# Patient Record
Sex: Male | Born: 1980 | Race: White | Hispanic: No | Marital: Married | Smoking: Former smoker
Health system: Southern US, Community
[De-identification: ages and names within clinical notes are randomized; demographics above are authoritative.]

## PROBLEM LIST (undated history)

## (undated) DIAGNOSIS — E039 Hypothyroidism, unspecified: Secondary | ICD-10-CM

## (undated) DIAGNOSIS — F419 Anxiety disorder, unspecified: Principal | ICD-10-CM

## (undated) DIAGNOSIS — S83206A Unspecified tear of unspecified meniscus, current injury, right knee, initial encounter: Secondary | ICD-10-CM

## (undated) HISTORY — DX: Hypothyroidism, unspecified: E03.9

## (undated) HISTORY — DX: Anxiety disorder, unspecified: F41.9

## (undated) HISTORY — DX: Unspecified tear of unspecified meniscus, current injury, right knee, initial encounter: S83.206A

---

## 2012-08-26 HISTORY — PX: VASECTOMY: SHX75

## 2012-11-04 ENCOUNTER — Ambulatory Visit (INDEPENDENT_AMBULATORY_CARE_PROVIDER_SITE_OTHER): Payer: BC Managed Care – PPO | Admitting: Family Medicine

## 2012-11-04 ENCOUNTER — Encounter: Payer: Self-pay | Admitting: Family Medicine

## 2012-11-04 VITALS — BP 129/80 | HR 82 | Ht 72.0 in | Wt 207.0 lb

## 2012-11-04 DIAGNOSIS — Z131 Encounter for screening for diabetes mellitus: Secondary | ICD-10-CM

## 2012-11-04 DIAGNOSIS — Z113 Encounter for screening for infections with a predominantly sexual mode of transmission: Secondary | ICD-10-CM

## 2012-11-04 DIAGNOSIS — F411 Generalized anxiety disorder: Secondary | ICD-10-CM

## 2012-11-04 DIAGNOSIS — S83206A Unspecified tear of unspecified meniscus, current injury, right knee, initial encounter: Secondary | ICD-10-CM | POA: Insufficient documentation

## 2012-11-04 DIAGNOSIS — F419 Anxiety disorder, unspecified: Secondary | ICD-10-CM

## 2012-11-04 DIAGNOSIS — E039 Hypothyroidism, unspecified: Secondary | ICD-10-CM

## 2012-11-04 DIAGNOSIS — G4730 Sleep apnea, unspecified: Secondary | ICD-10-CM

## 2012-11-04 DIAGNOSIS — Z13220 Encounter for screening for lipoid disorders: Secondary | ICD-10-CM

## 2012-11-04 HISTORY — DX: Unspecified tear of unspecified meniscus, current injury, right knee, initial encounter: S83.206A

## 2012-11-04 HISTORY — DX: Anxiety disorder, unspecified: F41.9

## 2012-11-04 HISTORY — DX: Hypothyroidism, unspecified: E03.9

## 2012-11-04 MED ORDER — LEVOTHYROXINE SODIUM 112 MCG PO TABS: 112.0000 ug | ORAL_TABLET | Freq: Every day | ORAL | Status: DC

## 2012-11-04 MED ORDER — ALPRAZOLAM 1 MG PO TABS: 1.0000 mg | ORAL_TABLET | Freq: Every evening | ORAL | Status: DC | PRN

## 2012-11-04 NOTE — Progress Notes (Signed)
CC: Joshua Kaiser is a 31 y.o. male is here for Establish Care and refills   Subjective: HPI:  Pleasant 31 year old here to establish care.  Carries a diagnosis of hypothyroidism: He's been taking thyroid replacement hormone for years. He reports trouble in the past finding an appropriate dose. He is unsure recent TSH date or value.  He's noticed slight unintentional weight gain over the past 3 months. He denies fatigue, uncontrolled anxiety, depression, constipation or diarrhea, hair or skin changes.  Carries a diagnosis anxiety for years. Has been using Xanax for 2-3 years. Taking it once a day and is very satisfied with the effect. Reports no history of substance or alcohol abuse. Denies paranoia, depression, nor other mental disturbance. Anxiety seems to be worse when in high stress environment such as work. Improves on the weekends when not at work and spending time for this family  He's never had his cholesterol checked, he has never been screened for type 2 diabetes.  He would like to be screened for common STDs but has no specific complaints or concerns or suspicions. Sexually active with a single partner, his wife  His wife tells him that he often stops breathing in his sleep while snoring, he will stop breathing for matter of seconds. He does not have any recollection of this happening. He denies nonrestorative sleep. He believes this is been going on for years it has gotten worse with recent weight gain. The above happens on a daily/nightly basis. Nothing in particular seems to make it better or worse  Review of Systems - General ROS: negative for - chills, fever, night sweats, weight gain or weight loss Ophthalmic ROS: negative for - decreased vision Psychological ROS: negative for -  depression ENT ROS: negative for - hearing change, nasal congestion, tinnitus or allergies Hematological and Lymphatic ROS: negative for - bleeding problems, bruising or swollen lymph nodes Breast ROS:  negative Respiratory ROS: no cough, shortness of breath, or wheezing Cardiovascular ROS: no chest pain or dyspnea on exertion Gastrointestinal ROS: no abdominal pain, change in bowel habits, or black or bloody stools Genito-Urinary ROS: negative for - genital discharge, genital ulcers, incontinence or abnormal bleeding from genitals Musculoskeletal ROS: negative for - joint pain or muscle pain Neurological ROS: negative for - headaches or memory loss Dermatological ROS: negative for lumps, mole changes, rash and skin lesion changes  Past Medical History  Diagnosis Date  . Hypothyroidism 11/04/2012  . Anxiety 11/04/2012  . Right knee meniscal tear 11/04/2012  . Right knee meniscal tear 11/04/2012     Family History  Problem Relation Age of Onset  . Hypertension Father   . Depression Mother      History  Substance Use Topics  . Smoking status: Current Every Day Smoker    Types: Cigarettes  . Smokeless tobacco: Not on file  . Alcohol Use: No     Objective: Filed Vitals:   11/04/12 0827  BP: 129/80  Pulse: 82    General: Alert and Oriented, No Acute Distress HEENT: Pupils equal, round, reactive to light. Conjunctivae clear.  External ears unremarkable, canals clear with intact TMs with appropriate landmarks.  Middle ear appears open without effusion. Pink inferior turbinates.  Moist mucous membranes, pharynx without inflammation nor lesions.  Neck supple without palpable lymphadenopathy nor abnormal masses. Lungs: Clear to auscultation bilaterally, no wheezing/ronchi/rales.  Comfortable work of breathing. Good air movement. Cardiac: Regular rate and rhythm. Normal S1/S2.  No murmurs, rubs, nor gallops.  . Extremities: No peripheral edema.  Strong peripheral pulses.  Mental Status: No depression, anxiety, nor agitation. Skin: Warm and dry.  Assessment & Plan: Shooter was seen today for establish care and refills.  Diagnoses and associated orders for this  visit:  Anxiety - ALPRAZolam (XANAX) 1 MG tablet; Take 1 tablet (1 mg total) by mouth at bedtime as needed.  Hypothyroidism - TSH - levothyroxine (SYNTHROID, LEVOTHROID) 112 MCG tablet; Take 1 tablet (112 mcg total) by mouth daily.  Screening, lipid - Lipid panel  Screening for diabetes mellitus - BASIC METABOLIC PANEL WITH GFR  Screen for std (sexually transmitted disease) - RPR - HIV Antibody ( Reflex) - Hepatitis B Surface AntiGEN - GC/chlamydia probe amp, urine  Sleep apnea - Nocturnal polysomnography (NPSG); Future  Other Orders - Discontinue: levothyroxine (SYNTHROID, LEVOTHROID) 112 MCG tablet; Take 112 mcg by mouth daily. - Discontinue: ALPRAZolam (XANAX) 1 MG tablet; Take 1 mg by mouth at bedtime as needed.    Anxiety: Stable and controlled, continue when necessary use of Xanax, [place] controlled substance database reviewed without suspicious activity Hypothyroidism: Clinically sounds  uncontrolled with weight gain, checking TSH and will adjust levothyroxine as needed He's due for dyslipidemia screening He is due for BMP for diabetic screening Sleep apnea: I believe he warrants a sleep study for consideration of CPAP We'll be checking for common STDs listed above  Return in about 3 months (around 02/02/2013).

## 2012-11-16 LAB — LIPID PANEL
Cholesterol: 145 mg/dL (ref 0–200)
HDL: 46 mg/dL (ref >39)
LDL Cholesterol: 87 mg/dL (ref 0–99)
Total CHOL/HDL Ratio: 3.2 ratio
Triglycerides: 60 mg/dL (ref <150)
VLDL: 12 mg/dL (ref 0–40)

## 2012-11-16 LAB — TSH: TSH: 6.966 u[IU]/mL — ABNORMAL HIGH (ref 0.350–4.500)

## 2012-11-16 LAB — BASIC METABOLIC PANEL WITH GFR
BUN: 14 mg/dL (ref 6–23)
CO2: 24 mEq/L (ref 19–32)
GFR, Est African American: >89 mL/min
Glucose, Bld: 92 mg/dL (ref 70–99)
Potassium: 4.1 mEq/L (ref 3.5–5.3)

## 2012-11-16 LAB — HEPATITIS B SURFACE ANTIGEN: Hepatitis B Surface Ag: NEGATIVE

## 2012-11-17 ENCOUNTER — Telehealth: Payer: Self-pay | Admitting: Family Medicine

## 2012-11-17 DIAGNOSIS — E039 Hypothyroidism, unspecified: Secondary | ICD-10-CM

## 2012-11-17 LAB — GC/CHLAMYDIA PROBE AMP, URINE
Chlamydia, Swab/Urine, PCR: NEGATIVE
GC Probe Amp, Urine: NEGATIVE

## 2012-11-17 MED ORDER — LEVOTHYROXINE SODIUM 125 MCG PO TABS: 125.0000 ug | ORAL_TABLET | Freq: Every day | ORAL | Status: DC

## 2012-11-17 NOTE — Telephone Encounter (Signed)
Joshua Kaiser, Will you please let Joshua Kaiser know that his thyroid function appears to be slightly underactive.  I've set in a slightly [facility] [address]  It'll be a 125 microgram tablet to replace the former 112.  I'd like him to return in 3 months so we can recheck his thyroid function. So far, alll his other labs have been negative from a infectious stand point, there's one more pending but I'll let him know if that comes back concerning.  His cholesterol panel looks amazing as does his kidney function and fasting glucose.  There's no need to change any other medications, diet, lifestyle habits at this time. Thank you

## 2012-11-17 NOTE — Telephone Encounter (Signed)
Pt.notified

## 2012-12-24 ENCOUNTER — Other Ambulatory Visit: Payer: Self-pay

## 2013-01-06 ENCOUNTER — Other Ambulatory Visit: Payer: Self-pay | Admitting: Family Medicine

## 2013-01-06 NOTE — Telephone Encounter (Signed)
(  [place] controlled sub database checked)  Sue Lush, Will you please fax to pharmacy or allow him to pick up the Rx placed in your inbox.  Thank you.

## 2013-02-01 ENCOUNTER — Encounter: Payer: Self-pay | Admitting: Family Medicine

## 2013-02-01 ENCOUNTER — Ambulatory Visit (INDEPENDENT_AMBULATORY_CARE_PROVIDER_SITE_OTHER): Payer: BC Managed Care – PPO | Admitting: Family Medicine

## 2013-02-01 VITALS — BP 126/73 | HR 74 | Wt 197.0 lb

## 2013-02-01 DIAGNOSIS — R0609 Other forms of dyspnea: Secondary | ICD-10-CM

## 2013-02-01 DIAGNOSIS — F419 Anxiety disorder, unspecified: Secondary | ICD-10-CM

## 2013-02-01 DIAGNOSIS — E039 Hypothyroidism, unspecified: Secondary | ICD-10-CM

## 2013-02-01 DIAGNOSIS — F411 Generalized anxiety disorder: Secondary | ICD-10-CM

## 2013-02-01 DIAGNOSIS — R0683 Snoring: Secondary | ICD-10-CM

## 2013-02-01 NOTE — Progress Notes (Signed)
CC: Joshua Kaiser is a 32 y.o. male is here for f/u anxiety and f/u thyroid   Subjective: HPI:  Followup hypothyroidism: Patient reports good compliance with 125 mcg a day. Reports increased energy since starting this 3 months ago. Denies worsening anxiety. Restlessness, tremor, sleep disturbance, unintentional weight loss or gain. Denies constipation or diarrhea. Denies hair or skin complaints  Followup anxiety: Continues to take half a tablet to a full tablet of Xanax most nights of the week. Typically does not use this on the weekends. Reports anxiety is still worsened by the stress of work. Not interfering with quality of life. Denies mental disturbance, paranoia, depression, alcohol use, nor recreational drug use  Followup snoring: He decided to postpone sleep study. Wife states that his snoring has gotten worse now noticing gasping more frequently with multiple seconds of breath with this prior to this. He denies nonrestorative sleep but does admit to occasional daytime sleepiness.  Review Of Systems Outlined In HPI  Past Medical History  Diagnosis Date  . Hypothyroidism 11/04/2012  . Anxiety 11/04/2012  . Right knee meniscal tear 11/04/2012  . Right knee meniscal tear 11/04/2012     Family History  Problem Relation Age of Onset  . Hypertension Father   . Depression Mother      History  Substance Use Topics  . Smoking status: Current Every Day Smoker    Types: Cigarettes  . Smokeless tobacco: Not on file  . Alcohol Use: No     Objective: Filed Vitals:   02/01/13 0826  BP: 126/73  Pulse: 74    Vital signs reviewed. General: Alert and Oriented, No Acute Distress HEENT: Pupils equal, round, reactive to light. Conjunctivae clear.  External ears unremarkable.  Moist mucous membranes. Lungs: Clear and comfortable work of breathing, speaking in full sentences without accessory muscle use. No rhonchi rales or wheezing Cardiac: Regular rate and rhythm.  No murmurs Neuro:  CN II-XII grossly intact, gait normal. Extremities: No peripheral edema.  Strong peripheral pulses.  Mental Status: No depression, anxiety, nor agitation. Logical though process. Skin: Warm and dry.  Assessment & Plan: Hassaan was seen today for f/u anxiety and f/u thyroid.  Diagnoses and associated orders for this visit:  Hypothyroidism - TSH  Snoring  Anxiety    Hypothyroidism: Clinically improved however due for TSH Snoring: Worsening, will rule out sleep apnea with home sleep study S[facility] referral to be placed Anxiety: Stable, continue Xanax regimen.  Return in about 3 months (around 05/04/2013).

## 2013-02-06 ENCOUNTER — Encounter: Payer: Self-pay | Admitting: Family Medicine

## 2013-02-06 ENCOUNTER — Telehealth: Payer: Self-pay | Admitting: Family Medicine

## 2013-02-06 NOTE — Telephone Encounter (Signed)
Called and no answer; vm states mailbox has not been set up yet. Sent pt a message through [employer]

## 2013-02-06 NOTE — Telephone Encounter (Signed)
Joshua Kaiser, Will you please let Joshua Kaiser know that I'd be happy to discuss risks and benefits of starting something like zoloft however it would require an office visit.

## 2013-02-14 ENCOUNTER — Telehealth: Payer: Self-pay | Admitting: Family Medicine

## 2013-02-14 DIAGNOSIS — G4733 Obstructive sleep apnea (adult) (pediatric): Secondary | ICD-10-CM | POA: Insufficient documentation

## 2013-02-15 ENCOUNTER — Ambulatory Visit (INDEPENDENT_AMBULATORY_CARE_PROVIDER_SITE_OTHER): Payer: BC Managed Care – PPO | Admitting: Family Medicine

## 2013-02-15 ENCOUNTER — Encounter: Payer: Self-pay | Admitting: Family Medicine

## 2013-02-15 ENCOUNTER — Telehealth: Payer: Self-pay | Admitting: *Deleted

## 2013-02-15 VITALS — BP 106/61 | HR 63 | Ht 72.0 in | Wt 196.0 lb

## 2013-02-15 DIAGNOSIS — F411 Generalized anxiety disorder: Secondary | ICD-10-CM

## 2013-02-15 DIAGNOSIS — F419 Anxiety disorder, unspecified: Secondary | ICD-10-CM

## 2013-02-15 DIAGNOSIS — G4733 Obstructive sleep apnea (adult) (pediatric): Secondary | ICD-10-CM

## 2013-02-15 MED ORDER — AMBULATORY NON FORMULARY MEDICATION: Status: DC

## 2013-02-15 MED ORDER — CITALOPRAM HYDROBROMIDE 20 MG PO TABS: ORAL_TABLET | ORAL | Status: DC

## 2013-02-15 NOTE — Progress Notes (Signed)
CC: Joshua Kaiser is a 32 y.o. male is here for anxiety/depression   Subjective: HPI:  Patient accompanied by wife, Joshua Kaiser. Patient reports years of anxiety and restlessness described as constant worrying about trivial things such as landscaping or more serious things such as finances at the house. Symptoms have been present ever since he was a young adult. Symptoms are slightly improved with nightly Xanax use.  Symptoms seem to be worsened when variables in life outweigh routine predictable ways of living. Symptoms were slightly worsened with her first child and later with her twins however he feels that he and his wife have a grasp on parenting right now. Patient reports not reaching for help in the past do to mistrust with former physicians in fears of being put on medications that he has seen his mom require for her bipolar disorder and possible schizophrenia. Patient tells me severity of symptoms are moderate in interfering with quality of life.  He denies manic symptoms such as promiscuous behavior, irresponsible spending of money, trouble with the law, feelings of elation, lack of need for sleep. Denies.the way down to self or others. Denies paranoia, delusions, nor hallucinations.  He denies heavy alcohol use, recreational drug use, nor smoking.   Review Of Systems Outlined In HPI  Past Medical History  Diagnosis Date  . Hypothyroidism 11/04/2012  . Anxiety 11/04/2012  . Right knee meniscal tear 11/04/2012  . Right knee meniscal tear 11/04/2012     Family History  Problem Relation Age of Onset  . Hypertension Father   . Depression Mother      History  Substance Use Topics  . Smoking status: Current Every Day Smoker    Types: Cigarettes  . Smokeless tobacco: Not on file  . Alcohol Use: No     Objective: Filed Vitals:   02/15/13 0838  BP: 106/61  Pulse: 63    Vital signs reviewed. General: Alert and Oriented, No Acute Distress HEENT: Pupils equal, round, reactive to  light. Conjunctivae clear.  External ears unremarkable.  Moist mucous membranes. Lungs: Clear and comfortable work of breathing, speaking in full sentences without accessory muscle use. Cardiac: Regular rate and rhythm.  Neuro: CN II-XII grossly intact, gait normal. Extremities: No peripheral edema.  Strong peripheral pulses.  Mental Status: No depression, anxiety, nor agitation. Logical though process. Well dressed, good eye contact. Skin: Warm and dry.  Assessment & Plan: [name] was seen today for anxiety/depression.  Diagnoses and associated orders for this visit:  Moderate obstructive sleep apnea  Anxiety - citalopram (CELEXA) 20 MG tablet; Half tablet by mouth daily for one week, the full tablet.    Patient's largest fear was that he may have early signs of schizophrenia or bipolar disorder. Reassurance provided to him and his wife that this is unlikely. Suspect uncontrolled anxiety, we'll keep in mind OCD but still lacking quite a few characteristics. Patient is quite open to starting citalopram. I've asked him to return in 4 weeks.  25 minutes spent face-to-face during visit today of which at least 50% was counseling or coordinating care regarding anxiety.   Return in about 4 weeks (around 03/15/2013).

## 2013-02-15 NOTE — Telephone Encounter (Signed)
Sue Lush, Will you please contact Standley Dakins at [facility] (card in black metal basket on counter) to see if they can help with a auto-cpap for Kionte.  Rx and copy of sleep study in you inbox (original report sent to be scanned).

## 2013-02-15 NOTE — Telephone Encounter (Signed)
Faxed.order,sleep study, demo, insurance cards to [phone].[facility]

## 2013-02-20 ENCOUNTER — Telehealth: Payer: Self-pay | Admitting: Family Medicine

## 2013-02-20 ENCOUNTER — Encounter: Payer: Self-pay | Admitting: Family Medicine

## 2013-02-20 DIAGNOSIS — F411 Generalized anxiety disorder: Secondary | ICD-10-CM

## 2013-02-20 MED ORDER — BUSPIRONE HCL 10 MG PO TABS: ORAL_TABLET | ORAL | Status: DC

## 2013-02-20 NOTE — Telephone Encounter (Signed)
Joshua Kaiser, Will you please let Joshua Kaiser know that I got his email and it sounds like the citalopram isn't agreeing with him.  He's on a low enough dose to where he can stop this without need for a taper.  I've [facility] in an alternative [facility]  It works on the same neurotransmitters for anxiety but hopefully won't cause similar side effects.

## 2013-02-20 NOTE — Telephone Encounter (Signed)
Pt notified and voiced understanding 

## 2013-02-27 ENCOUNTER — Encounter: Payer: Self-pay | Admitting: Family Medicine

## 2013-03-02 ENCOUNTER — Encounter: Payer: Self-pay | Admitting: Family Medicine

## 2013-03-03 ENCOUNTER — Encounter: Payer: Self-pay | Admitting: Family Medicine

## 2013-03-06 ENCOUNTER — Other Ambulatory Visit: Payer: Self-pay | Admitting: Family Medicine

## 2013-03-06 DIAGNOSIS — F411 Generalized anxiety disorder: Secondary | ICD-10-CM

## 2013-03-06 NOTE — Telephone Encounter (Signed)
Sue Lush, Will you please ask Joshua Kaiser to only take half a tablet of buspar twice a day for the next week to see if it helps his side effects without compromising the positive effects he's getting.

## 2013-03-06 NOTE — Telephone Encounter (Signed)
Pt notifed of Dr. Genelle Bal instructions. Pt states he has been taking a half of a half tablet and still having the side effects

## 2013-03-07 NOTE — Telephone Encounter (Signed)
rx faxed

## 2013-03-07 NOTE — Telephone Encounter (Signed)
Sue Lush, Rx signed and placed in you inbox.  If Kingstin still has issues cutting back to just a half tablet of buspirone once a day instead of twice then I'd like him to f/u to discuss a variety of other medication options.

## 2013-04-05 ENCOUNTER — Encounter: Payer: Self-pay | Admitting: Family Medicine

## 2013-04-06 ENCOUNTER — Other Ambulatory Visit: Payer: Self-pay | Admitting: Family Medicine

## 2013-04-19 ENCOUNTER — Telehealth: Payer: Self-pay | Admitting: Family Medicine

## 2013-04-19 MED ORDER — DICLOFENAC SODIUM 75 MG PO TBEC: 75.0000 mg | DELAYED_RELEASE_TABLET | Freq: Two times a day (BID) | ORAL | Status: DC | PRN

## 2013-04-19 NOTE — Telephone Encounter (Signed)
Per patient request

## 2013-05-05 ENCOUNTER — Other Ambulatory Visit: Payer: Self-pay | Admitting: Family Medicine

## 2013-05-05 DIAGNOSIS — G4700 Insomnia, unspecified: Secondary | ICD-10-CM

## 2013-05-05 NOTE — Telephone Encounter (Signed)
Joshua Kaiser/Coverage, Rx printed and placed in inbox ready for faxing off or pickup.

## 2013-06-05 ENCOUNTER — Other Ambulatory Visit: Payer: Self-pay | Admitting: Family Medicine

## 2013-07-07 ENCOUNTER — Other Ambulatory Visit: Payer: Self-pay | Admitting: Family Medicine

## 2013-07-07 NOTE — Telephone Encounter (Signed)
Andrea, Rx placed in in-box ready for pickup/faxing.  

## 2013-07-11 NOTE — Telephone Encounter (Signed)
Faxed on Friday and refaxed this am

## 2013-08-09 ENCOUNTER — Other Ambulatory Visit: Payer: Self-pay | Admitting: Family Medicine

## 2013-08-16 ENCOUNTER — Ambulatory Visit (INDEPENDENT_AMBULATORY_CARE_PROVIDER_SITE_OTHER): Payer: BC Managed Care – PPO | Admitting: Family Medicine

## 2013-08-16 ENCOUNTER — Encounter: Payer: Self-pay | Admitting: Family Medicine

## 2013-08-16 VITALS — BP 127/80 | HR 64 | Wt 196.0 lb

## 2013-08-16 DIAGNOSIS — Z7189 Other specified counseling: Secondary | ICD-10-CM

## 2013-08-16 DIAGNOSIS — E039 Hypothyroidism, unspecified: Secondary | ICD-10-CM

## 2013-08-16 DIAGNOSIS — F411 Generalized anxiety disorder: Secondary | ICD-10-CM

## 2013-08-16 DIAGNOSIS — F419 Anxiety disorder, unspecified: Secondary | ICD-10-CM

## 2013-08-16 MED ORDER — ALPRAZOLAM 1 MG PO TABS: ORAL_TABLET | ORAL | Status: DC

## 2013-08-16 NOTE — Progress Notes (Signed)
CC: Joshua Kaiser is a 32 y.o. male is here for anxiety f/u and thyroid med   Subjective: HPI:  Followup anxiety: Patient decided to stop BuSpar as it was giving him a zombie like feeling he was having trouble concentrating after taking medication. He continues to take Xanax every night to help with sleep he reports drastic improvement and resolution of his mind racing and keeping him awake due to subjective anxiety prior to taking Xanax. He denies recreational drug use, alcohol use or tobacco use. He denies daytime anxiety or restlessness. Denies mental disturbance other than above, denies depression or paranoia.  Followup hypothyroidism: Continues to take levothyroxine a daily basis no missed doses. Denies unintentional weight loss or gain, diarrhea, constipation, tremor.  Has questions regarding whether or not he should receive tetanus booster and flu shot this year. He believes it's been less than 10 years since his last tetanus booster   Review Of Systems Outlined In HPI  Past Medical History  Diagnosis Date  . Hypothyroidism 11/04/2012  . Anxiety 11/04/2012  . Right knee meniscal tear 11/04/2012  . Right knee meniscal tear 11/04/2012     Family History  Problem Relation Age of Onset  . Hypertension Father   . Depression Mother      History  Substance Use Topics  . Smoking status: Current Every Day Smoker    Types: Cigarettes  . Smokeless tobacco: Not on file  . Alcohol Use: No     Objective: Filed Vitals:   08/16/13 0852  BP: 127/80  Pulse: 64    General: Alert and Oriented, No Acute Distress HEENT: Pupils equal, round, reactive to light. Conjunctivae clear.  Moist mucous membranes pharynx unremarkable Lungs: Clear to auscultation bilaterally, no wheezing/ronchi/rales.  Comfortable work of breathing. Good air movement. Cardiac: Regular rate and rhythm. Normal S1/S2.  No murmurs, rubs, nor gallops.   Extremities: No peripheral edema.  Strong peripheral pulses.   Mental Status: No depression, anxiety, nor agitation. Skin: Warm and dry.  Assessment & Plan: Delos was seen today for anxiety f/u and thyroid med.  Diagnoses and associated orders for this visit:  Anxiety - ALPRAZolam (XANAX) 1 MG tablet; TAKE 1 TABLET AT BEDTIME AS NEEDED FOR SLEEP  Hypothyroidism - TSH  Immunization counseling    Encourage patient to receive flu shot, he will receive from his wife this afternoon. He believes he had Tdap within the last 10 years therefore he is up-to-date Hypothyroidism: Due for TSH check will send in refills if TSH is within normal limits Anxiety: Controlled continue as needed Xanax for sleep  He can return in 6 months if TSH is normal  25 minutes spent face-to-face during visit today of which at least 50% was counseling or coordinating care regarding anxiety, hypothyroidism, immunization counseling.   Return in about 6 months (around 02/14/2014).

## 2013-08-17 ENCOUNTER — Telehealth: Payer: Self-pay | Admitting: Family Medicine

## 2013-08-17 DIAGNOSIS — E039 Hypothyroidism, unspecified: Secondary | ICD-10-CM

## 2013-08-17 MED ORDER — LEVOTHYROXINE SODIUM 137 MCG PO TABS: ORAL_TABLET | ORAL | Status: DC

## 2013-08-17 NOTE — Telephone Encounter (Signed)
Sue Lush, Will you please let Joshua Kaiser know that his thyroid supplementation appears to be under-dosed, I'd encourage him to slightly increase his dose to daily, [facility] [address], I'd encourage him to have his TSH rechecked in 3 months.

## 2013-08-17 NOTE — Telephone Encounter (Signed)
Left message on vm

## 2013-08-28 ENCOUNTER — Encounter: Payer: Self-pay | Admitting: Family Medicine

## 2013-08-31 NOTE — Telephone Encounter (Signed)
Patient called request to have referral for testing for ad/hd. He stated he received a message from you about wanting him to have extra testing. Thanks

## 2013-11-25 ENCOUNTER — Other Ambulatory Visit: Payer: Self-pay | Admitting: Family Medicine

## 2013-11-26 ENCOUNTER — Other Ambulatory Visit: Payer: Self-pay | Admitting: Family Medicine

## 2013-12-25 ENCOUNTER — Other Ambulatory Visit: Payer: Self-pay | Admitting: Family Medicine

## 2014-02-14 ENCOUNTER — Ambulatory Visit (INDEPENDENT_AMBULATORY_CARE_PROVIDER_SITE_OTHER): Payer: BC Managed Care – PPO | Admitting: Family Medicine

## 2014-02-14 ENCOUNTER — Encounter: Payer: Self-pay | Admitting: Family Medicine

## 2014-02-14 VITALS — BP 135/82 | HR 72 | Wt 209.0 lb

## 2014-02-14 DIAGNOSIS — F419 Anxiety disorder, unspecified: Secondary | ICD-10-CM

## 2014-02-14 DIAGNOSIS — E039 Hypothyroidism, unspecified: Secondary | ICD-10-CM

## 2014-02-14 DIAGNOSIS — G4733 Obstructive sleep apnea (adult) (pediatric): Secondary | ICD-10-CM

## 2014-02-14 DIAGNOSIS — F411 Generalized anxiety disorder: Secondary | ICD-10-CM

## 2014-02-14 DIAGNOSIS — E663 Overweight: Secondary | ICD-10-CM

## 2014-02-14 LAB — TSH: TSH: 3.161 u[IU]/mL (ref 0.350–4.500)

## 2014-02-14 MED ORDER — ALPRAZOLAM 1 MG PO TABS: ORAL_TABLET | ORAL | Status: DC

## 2014-02-14 MED ORDER — AMBULATORY NON FORMULARY MEDICATION: Status: DC

## 2014-02-14 NOTE — Progress Notes (Signed)
CC: Joshua FrameMichael Kaiser is a 33 y.o. male is here for f/u anxiety and f/u thyroid   Subjective: HPI:  Followup anxiety: Continues to take Xanax most nights of the week only to help with sleep. It helps both with getting to sleep and staying asleep. Denies anxiety during the daytime that is interfering with quality of life.  Hypothyroidism: Continues to take levothyroxine on a daily basis without missed doses. Since increasing his dosage he does not note any unintentional weight loss, new anxiety, depression, mental disturbance, constipation or diarrhea  Obstructive sleep apnea: Still has been unable to afford CPAP due to insurance issues. He has a new insurance provider now. He expresses interest in using machine if financially possible. Based on wife's comments it sounds like he still snores on a nightly basis. He's noticed unintentional weight loss since I saw him last. Occasional daytime sleepiness.   Review Of Systems Outlined In HPI  Past Medical History  Diagnosis Date  . Hypothyroidism 11/04/2012  . Anxiety 11/04/2012  . Right knee meniscal tear 11/04/2012  . Right knee meniscal tear 11/04/2012    Past Surgical History  Procedure Laterality Date  . Vasectomy  08/26/2012   Family History  Problem Relation Age of Onset  . Hypertension Father   . Depression Mother     History   Social History  . Marital Status: Married    Spouse Name: N/A    Number of Children: N/A  . Years of Education: N/A   Occupational History  . Not on file.   Social History Main Topics  . Smoking status: Current Every Day Smoker    Types: Cigarettes  . Smokeless tobacco: Not on file  . Alcohol Use: No  . Drug Use: No  . Sexual Activity: Yes   Other Topics Concern  . Not on file   Social History Narrative  . No narrative on file     Objective: BP 135/82  Pulse 72  Wt 209 lb (94.802 kg)  General: Alert and Oriented, No Acute Distress HEENT: Pupils equal, round, reactive to light.  Conjunctivae clear.  Moist mucous membranes pharynx unremarkable Lungs: Clear to auscultation bilaterally, no wheezing/ronchi/rales.  Comfortable work of breathing. Good air movement. Cardiac: Regular rate and rhythm. Normal S1/S2.  No murmurs, rubs, nor gallops.   Extremities: No peripheral edema.  Strong peripheral pulses.  Mental Status: No depression, anxiety, nor agitation. Skin: Warm and dry.  Assessment & Plan: Joshua NeedleMichael was seen today for f/u anxiety and f/u thyroid.  Diagnoses and associated orders for this visit:  Anxiety - ALPRAZolam (XANAX) 1 MG tablet; TAKE 1 TABLET AT BEDTIME AS NEEDED SLEEP  Hypothyroidism - TSH  Overweight  Moderate obstructive sleep apnea - AMBULATORY NON FORMULARY MEDICATION; Auto-CPAP with initial pressure of 6cm H20.  Dx: Moderate Obstructive Sleep Apnea 327.23    Anxiety: Controlled continue as needed Xanax at bedtime Hypothyroidism: Clinically control but due for repeat TSH Overweight with moderate OSA: Uncontrolled chronic condition, If financially reasonable I strongly encouraged him to start CPAP, a [facility] will [facility] to [facility] to see if his new insurance provides better coverage.   Return in about 3 months (around 05/16/2014).

## 2014-02-15 ENCOUNTER — Telehealth: Payer: Self-pay | Admitting: Family Medicine

## 2014-02-15 MED ORDER — LEVOTHYROXINE SODIUM 137 MCG PO TABS: ORAL_TABLET | ORAL | Status: DC

## 2014-02-15 NOTE — Telephone Encounter (Signed)
Pt informed.  Misty Ahmad, LPN  

## 2014-02-15 NOTE — Telephone Encounter (Signed)
Sue Lushndrea, Will you please let patient know that thyroid supplementation was seen to be adequate, [facility]

## 2014-05-24 ENCOUNTER — Other Ambulatory Visit: Payer: Self-pay | Admitting: Family Medicine

## 2014-05-24 ENCOUNTER — Other Ambulatory Visit: Payer: Self-pay | Admitting: *Deleted

## 2014-05-24 NOTE — Telephone Encounter (Signed)
ERROR

## 2014-05-31 ENCOUNTER — Other Ambulatory Visit: Payer: Self-pay | Admitting: Family Medicine

## 2014-08-01 ENCOUNTER — Other Ambulatory Visit: Payer: Self-pay | Admitting: *Deleted

## 2014-08-01 MED ORDER — ALPRAZOLAM 1 MG PO TABS: ORAL_TABLET | ORAL | Status: DC

## 2014-08-01 NOTE — Telephone Encounter (Signed)
Patient called for alpralazom refills. Printed and put in Dr. Genelle Bal box. Corliss Skains, CMA

## 2014-08-16 ENCOUNTER — Ambulatory Visit (INDEPENDENT_AMBULATORY_CARE_PROVIDER_SITE_OTHER): Payer: Managed Care, Other (non HMO) | Admitting: Family Medicine

## 2014-08-16 ENCOUNTER — Encounter: Payer: Self-pay | Admitting: Family Medicine

## 2014-08-16 ENCOUNTER — Telehealth: Payer: Self-pay | Admitting: *Deleted

## 2014-08-16 VITALS — BP 129/77 | HR 65 | Ht 76.0 in | Wt 204.0 lb

## 2014-08-16 DIAGNOSIS — G4733 Obstructive sleep apnea (adult) (pediatric): Secondary | ICD-10-CM

## 2014-08-16 DIAGNOSIS — F419 Anxiety disorder, unspecified: Secondary | ICD-10-CM

## 2014-08-16 DIAGNOSIS — E031 Congenital hypothyroidism without goiter: Secondary | ICD-10-CM

## 2014-08-16 DIAGNOSIS — Z23 Encounter for immunization: Secondary | ICD-10-CM

## 2014-08-16 MED ORDER — ALPRAZOLAM 1 MG PO TABS: ORAL_TABLET | ORAL | Status: DC

## 2014-08-16 MED ORDER — BUPROPION HCL ER (XL) 150 MG PO TB24: 150.0000 mg | ORAL_TABLET | Freq: Every day | ORAL | Status: DC

## 2014-08-16 MED ORDER — LEVOTHYROXINE SODIUM 137 MCG PO TABS: ORAL_TABLET | ORAL | Status: DC

## 2014-08-16 NOTE — Telephone Encounter (Signed)
Sue LushAndrea, What I was referring to was an assistance with physically acquiring the equipment, I know of now financial assistance other than whatever insurance covers.  Can you see if [facility] (or whatever their new name is) can provide him with this assistance.  Rx already in his med list.

## 2014-08-16 NOTE — Telephone Encounter (Signed)
Call[facility] and [facility] to see if assistance is avail

## 2014-08-16 NOTE — Progress Notes (Signed)
CC: Joshua Kaiser is a 33 y.o. male is here for Follow-up   Subjective: HPI:  Followup anxiety: Continues to take Xanax on a daily basis, in the past he had only use this right before bed to help falling asleep. However now he is having to take a single dose during the daytime if he is having difficulty with irritability with coworkers or clients. It seems like the medication is losing its effectiveness over time. He has not been taking more than one dose a day. He wants no further something to help him with preventing any anxiety in the future other than citalopram or BuSpar that caused intolerable side effects in the past.  Overall his anxiety his discomfort in severity improved with Xanax and worsened by job responsibilities and father responsibilities. Denies any other mental disturbance.  Followup hypothyroidism: Continues to take 137 mcg of levothyroxine on a daily basis without missed doses. Denies unintentional weight loss or gain. Denies GI disturbance, denies skin changes, denies any depression.   Review Of Systems Outlined In HPI  Past Medical History  Diagnosis Date  . Hypothyroidism 11/04/2012  . Anxiety 11/04/2012  . Right knee meniscal tear 11/04/2012  . Right knee meniscal tear 11/04/2012    Past Surgical History  Procedure Laterality Date  . Vasectomy  08/26/2012   Family History  Problem Relation Age of Onset  . Hypertension Father   . Depression Mother     History   Social History  . Marital Status: Married    Spouse Name: N/A    Number of Children: N/A  . Years of Education: N/A   Occupational History  . Not on file.   Social History Main Topics  . Smoking status: Current Every Day Smoker    Types: Cigarettes  . Smokeless tobacco: Not on file  . Alcohol Use: No  . Drug Use: No  . Sexual Activity: Yes   Other Topics Concern  . Not on file   Social History Narrative  . No narrative on file     Objective: BP 129/77  Pulse 65  Ht 6\' 4"  (1.93  m)  Wt 204 lb (92.534 kg)  BMI 24.84 kg/m2  Vital signs reviewed. General: Alert and Oriented, No Acute Distress HEENT: Pupils equal, round, reactive to light. Conjunctivae clear.  External ears unremarkable.  Moist mucous membranes. Lungs: Clear and comfortable work of breathing, speaking in full sentences without accessory muscle use. Cardiac: Regular rate and rhythm.  Neuro: CN II-XII grossly intact, gait normal. Extremities: No peripheral edema.  Strong peripheral pulses.  Mental Status: No depression, nor agitation. Logical though process. Mild anxiety Skin: Warm and dry.  Assessment & Plan: [name] was seen today for follow-up.  Diagnoses and associated orders for this visit:  Anxiety - buPROPion (WELLBUTRIN XL) 150 MG 24 hr tablet; Take 1 tablet (150 mg total) by mouth daily. - ALPRAZolam (XANAX) 1 MG tablet; TAKE 1 TABLET BY MOUTH AT BEDTIME AS NEEDED FOR SLEEP  Congenital hypothyroidism without goiter - levothyroxine (SYNTHROID, LEVOTHROID) 137 MCG tablet; TAKE 1 TABLET EVERY DAY    Anxiety: Uncontrolled, continue as needed Xanax however starting Wellbutrin to help minimize the need for Xanax. Followup in 3 months if providing benefit otherwise sooner if intolerable side effects or ineffectiveness. Hypothyroidism: Controlled, continue levothyroxine and will repeat TSH in 6 months.  Return in about 3 months (around 11/16/2014).

## 2014-08-16 NOTE — Telephone Encounter (Signed)
Pt states you and he had talked in the past about about assistance for CPAP machine. He wanted mentioned that you knew of a program?

## 2014-08-24 NOTE — Telephone Encounter (Signed)
Called Melissa at [employer] and spoke with her about financial assistance. We will send an order for a cpap machine and they will send a financial assistance form to the patient for him to fill out and return. From there they will let their district manager review his form and let him know whether or not he qualifies for any type of assistance.

## 2014-11-16 ENCOUNTER — Ambulatory Visit (INDEPENDENT_AMBULATORY_CARE_PROVIDER_SITE_OTHER): Payer: Managed Care, Other (non HMO) | Admitting: Family Medicine

## 2014-11-16 ENCOUNTER — Encounter: Payer: Self-pay | Admitting: Family Medicine

## 2014-11-16 VITALS — BP 130/84 | HR 70 | Wt 202.0 lb

## 2014-11-16 DIAGNOSIS — G4733 Obstructive sleep apnea (adult) (pediatric): Secondary | ICD-10-CM

## 2014-11-16 DIAGNOSIS — F419 Anxiety disorder, unspecified: Secondary | ICD-10-CM

## 2014-11-16 MED ORDER — BUPROPION HCL ER (XL) 150 MG PO TB24: 150.0000 mg | ORAL_TABLET | Freq: Every day | ORAL | Status: DC

## 2014-11-16 NOTE — Progress Notes (Signed)
CC: Joshua Kaiser is a 34 y.o. male is here for discuss cpap   Subjective: HPI:  Follow-up anxiety: Since I saw him last he started taking Wellbutrin 150 mg daily. He states for the first hour he feels mildly moderately anxious however throughout the rest of the day he feels much less anxious compared to how he was feeling prior to taking this medication. He is using Xanax less now. He reports that he is bringing less stress home from work. He is overall happy with this response and not having any mental disturbance other than above once and now if an increased dose would help reduce anxiety in the first hour after taking it.  He requests guidance on whether or not he should have surgery for removal of his tonsils and adenoids to help with obstructive sleep apnea. Financially he cannot afford a CPAP machine and he does not trust himself that he would actually wear it every night due to fears that would keep him awake in his difficulty with something placed on his face and his head.    Review Of Systems Outlined In HPI  Past Medical History  Diagnosis Date  . Hypothyroidism 11/04/2012  . Anxiety 11/04/2012  . Right knee meniscal tear 11/04/2012  . Right knee meniscal tear 11/04/2012    Past Surgical History  Procedure Laterality Date  . Vasectomy  08/26/2012   Family History  Problem Relation Age of Onset  . Hypertension Father   . Depression Mother     History   Social History  . Marital Status: Married    Spouse Name: N/A    Number of Children: N/A  . Years of Education: N/A   Occupational History  . Not on file.   Social History Main Topics  . Smoking status: Current Every Day Smoker    Types: Cigarettes  . Smokeless tobacco: Not on file  . Alcohol Use: No  . Drug Use: No  . Sexual Activity: Yes   Other Topics Concern  . Not on file   Social History Narrative  . No narrative on file     Objective: BP 130/84 mmHg  Pulse 70  Wt 202 lb (91.627 kg)  General:  Alert and Oriented, No Acute Distress HEENT: Pupils equal, round, reactive to light. Conjunctivae clear.  Morris because membranes times unremarkable, no tonsillar hypertrophy. Lungs:clinical coworker breathing Cardiac: Regular rate and rhythm.  Extremities: No peripheral edema.  Strong peripheral pulses.  Mental Status: No depression, anxiety, nor agitation. Skin: Warm and dry.  Assessment & Plan: Joshua Kaiser was seen today for discuss cpap.  Diagnoses and associated orders for this visit:  Moderate obstructive sleep apnea - Ambulatory referral to ENT  Anxiety - buPROPion (WELLBUTRIN XL) 150 MG 24 hr tablet; Take 1 tablet (150 mg total) by mouth daily.     Anxiety: Overall controlled however he would like to know if he can completely alleviate all anxiety by taking an additional Wellbutrin, we discussed that he could take 2 tablets on a daily basis for one week and if beneficial call me and I will send him an updated prescription however is not beneficial or if any side effects occur cut back to one a day and continue at one day until follow-up in 3 months. Time was taken to answer all questions regarding whether or not surgery would help his sleep apnea. Discussed risks and benefits within my scope of care but advised him that it would be best to speak with an ear nose  and throat surgeon for more specific risks and benefits. I advised him that if he was a surgical candidate his recovery could take up to a month.  Discussed that sleep apnea will contribute to unintentional weight gain and hypertension in the future if not controlled, his wife wanted to make sure that he was pursuing this surgery for medical reasons other than just snoring.  25 minutes spent face-to-face during visit today of which at least 50% was counseling or coordinating care regarding: 1. Moderate obstructive sleep apnea   2. Anxiety       Return in about 3 months (around 02/15/2015) for thyroid.

## 2014-11-18 ENCOUNTER — Other Ambulatory Visit: Payer: Self-pay | Admitting: Family Medicine

## 2014-11-21 ENCOUNTER — Other Ambulatory Visit: Payer: Self-pay | Admitting: Family Medicine

## 2015-02-04 ENCOUNTER — Encounter: Payer: Self-pay | Admitting: Family Medicine

## 2015-02-15 ENCOUNTER — Ambulatory Visit (INDEPENDENT_AMBULATORY_CARE_PROVIDER_SITE_OTHER): Payer: Managed Care, Other (non HMO) | Admitting: Family Medicine

## 2015-02-15 ENCOUNTER — Encounter: Payer: Self-pay | Admitting: Family Medicine

## 2015-02-15 VITALS — BP 126/77 | HR 71 | Ht 72.0 in | Wt 205.0 lb

## 2015-02-15 DIAGNOSIS — E031 Congenital hypothyroidism without goiter: Secondary | ICD-10-CM

## 2015-02-15 DIAGNOSIS — F419 Anxiety disorder, unspecified: Secondary | ICD-10-CM

## 2015-02-15 DIAGNOSIS — G4733 Obstructive sleep apnea (adult) (pediatric): Secondary | ICD-10-CM | POA: Diagnosis not present

## 2015-02-15 LAB — TSH: TSH: 1.758 u[IU]/mL (ref 0.350–4.500)

## 2015-02-15 NOTE — Progress Notes (Signed)
CC: Joshua Kaiser is a 34 y.o. male is here for Follow-up   Subjective: HPI:  Follow-up hypothyroidism: Continues to take 137 g of levothyroxine on a daily basis. Denies unintentional weight gain or loss gastrointestinal complaints nor skin or hair concerns. Denies any known side effects  Follow-up anxiety: He tells me that he feels most comfortable, level, and anxiety free with taking only 150 mg of Wellbutrin daily basis. Taking 300 mg daily did not provide any benefit over just 150 mg. He denies any depression anxiety nor any mental disturbance or side effects from Wellbutrin.  Follow-up sleep apnea: He was given a mouth guard to help with sleep apnea, given to him by his ENT physician. He tells me he's only wanted a few times over the past month. It doesn't hurt but it's uncomfortable have a foreign object in his mouth and he is not gotten use to have it in the mouth while trying to fall asleep. When he does use it he does not notice any benefit from sleeping or snoring, if anything it takes longer go to sleep when using it. No nonrestorative sleep   Review Of Systems Outlined In HPI  Past Medical History  Diagnosis Date  . Hypothyroidism 11/04/2012  . Anxiety 11/04/2012  . Right knee meniscal tear 11/04/2012  . Right knee meniscal tear 11/04/2012    Past Surgical History  Procedure Laterality Date  . Vasectomy  08/26/2012   Family History  Problem Relation Age of Onset  . Hypertension Father   . Depression Mother     History   Social History  . Marital Status: Married    Spouse Name: N/A  . Number of Children: N/A  . Years of Education: N/A   Occupational History  . Not on file.   Social History Main Topics  . Smoking status: Current Every Day Smoker    Types: Cigarettes  . Smokeless tobacco: Not on file  . Alcohol Use: No  . Drug Use: No  . Sexual Activity: Yes   Other Topics Concern  . Not on file   Social History Narrative     Objective: BP 126/77  mmHg  Pulse 71  Ht 6' (1.829 m)  Wt 205 lb (92.987 kg)  BMI 27.80 kg/m2  General: Alert and Oriented, No Acute Distress HEENT: Pupils equal, round, reactive to light. Conjunctivae clear.  Moist mucous membranes Lungs: Clear to auscultation bilaterally, no wheezing/ronchi/rales.  Comfortable work of breathing. Good air movement. Cardiac: Regular rate and rhythm. Normal S1/S2.  No murmurs, rubs, nor gallops.   Extremities: No peripheral edema.  Strong peripheral pulses.  Mental Status: No depression, anxiety, nor agitation. Skin: Warm and dry.  Assessment & Plan: Joshua NeedleMichael was seen today for follow-up.  Diagnoses and all orders for this visit:  Congenital hypothyroidism without goiter Orders: -     TSH  Anxiety  Moderate obstructive sleep apnea   Hypothyroidism: Clinically controlled but due for TSH, continue levothyroxine 137 g daily pending results Anxiety: Controlled continue Wellbutrin Sleep apnea: Uncontrolled, since is having difficulty with compliance using a mouth guard discussed moderate exercise for 30 minutes most days of the week to help with weight loss which will ultimately help with sleep apnea.  Return in about 6 months (around 08/17/2015).

## 2015-02-18 ENCOUNTER — Telehealth: Payer: Self-pay | Admitting: Family Medicine

## 2015-02-18 DIAGNOSIS — E031 Congenital hypothyroidism without goiter: Secondary | ICD-10-CM

## 2015-02-18 MED ORDER — LEVOTHYROXINE SODIUM 137 MCG PO TABS: ORAL_TABLET | ORAL | Status: DC

## 2015-02-18 NOTE — Telephone Encounter (Signed)
Joshua Kaiser, Will you please let patient know that his thyroid supplement appears adequate therefore I've sent refills of this to his [facility] pharmacy.

## 2015-02-18 NOTE — Telephone Encounter (Addendum)
Left message on vm

## 2015-02-19 ENCOUNTER — Encounter: Payer: Self-pay | Admitting: Family Medicine

## 2015-02-19 DIAGNOSIS — Z125 Encounter for screening for malignant neoplasm of prostate: Secondary | ICD-10-CM | POA: Insufficient documentation

## 2015-02-19 MED ORDER — ALPRAZOLAM 1 MG PO TABS: ORAL_TABLET | ORAL | Status: DC

## 2015-03-24 ENCOUNTER — Other Ambulatory Visit: Payer: Self-pay | Admitting: Family Medicine

## 2015-04-21 ENCOUNTER — Other Ambulatory Visit: Payer: Self-pay | Admitting: Family Medicine

## 2015-05-28 ENCOUNTER — Other Ambulatory Visit: Payer: Self-pay | Admitting: Family Medicine

## 2015-06-20 ENCOUNTER — Encounter: Payer: Self-pay | Admitting: Family Medicine

## 2015-06-20 ENCOUNTER — Ambulatory Visit (INDEPENDENT_AMBULATORY_CARE_PROVIDER_SITE_OTHER): Payer: Managed Care, Other (non HMO) | Admitting: Family Medicine

## 2015-06-20 VITALS — BP 122/66 | HR 66 | Ht 72.0 in | Wt 207.0 lb

## 2015-06-20 DIAGNOSIS — Z0000 Encounter for general adult medical examination without abnormal findings: Secondary | ICD-10-CM

## 2015-06-20 NOTE — Progress Notes (Signed)
CC: Joshua Kaiser is a 34 y.o. male is here for Annual Exam   Subjective: HPI:  Colonoscopy: Starting at 25 Prostate: Discussed screening risks/beneifts with patient, will screen at age 47  Influenza Vaccine: No current indication Pneumovax: No current indication Td/Tdap: UTD Zoster: (Start 34 yo)  Requesting complete physical exam for [employer]  Review of Systems - General ROS: negative for - chills, fever, night sweats, weight gain or weight loss Ophthalmic ROS: negative for - decreased vision Psychological ROS: negative for - anxiety or depression ENT ROS: negative for - hearing change, nasal congestion, tinnitus or allergies Hematological and Lymphatic ROS: negative for - bleeding problems, bruising or swollen lymph nodes Breast ROS: negative Respiratory ROS: no cough, shortness of breath, or wheezing Cardiovascular ROS: no chest pain or dyspnea on exertion Gastrointestinal ROS: no abdominal pain, change in bowel habits, or black or bloody stools Genito-Urinary ROS: negative for - genital discharge, genital ulcers, incontinence or abnormal bleeding from genitals Musculoskeletal ROS: negative for - joint pain or muscle pain Neurological ROS: negative for - headaches or memory loss Dermatological ROS: negative for lumps, mole changes, rash and skin lesion changes  Past Medical History  Diagnosis Date  . Hypothyroidism 11/04/2012  . Anxiety 11/04/2012  . Right knee meniscal tear 11/04/2012  . Right knee meniscal tear 11/04/2012    Past Surgical History  Procedure Laterality Date  . Vasectomy  08/26/2012   Family History  Problem Relation Age of Onset  . Hypertension Father   . Depression Mother     Social History   Social History  . Marital Status: Married    Spouse Name: N/A  . Number of Children: N/A  . Years of Education: N/A   Occupational History  . Not on file.   Social History Main Topics  . Smoking status: Current Every Day Smoker    Types: Cigarettes   . Smokeless tobacco: Not on file  . Alcohol Use: No  . Drug Use: No  . Sexual Activity: Yes   Other Topics Concern  . Not on file   Social History Narrative     Objective: BP 122/66 mmHg  Pulse 66  Ht 6' (1.829 m)  Wt 207 lb (93.895 kg)  BMI 28.07 kg/m2  General: No Acute Distress HEENT: Atraumatic, normocephalic, conjunctivae normal without scleral icterus.  No nasal discharge, hearing grossly intact, TMs with good landmarks bilaterally with no middle ear abnormalities, posterior pharynx clear without oral lesions. Neck: Supple, trachea midline, no cervical nor supraclavicular adenopathy. Pulmonary: Clear to auscultation bilaterally without wheezing, rhonchi, nor rales. Cardiac: Regular rate and rhythm.  No murmurs, rubs, nor gallops. No peripheral edema.  2+ peripheral pulses bilaterally. Abdomen: Bowel sounds normal.  No masses.  Non-tender without rebound.  Negative Murphy's sign. MSK: Grossly intact, no signs of weakness.  Full strength throughout upper and lower extremities.  Full ROM in upper and lower extremities.  No midline spinal tenderness. Neuro: Gait unremarkable, CN II-XII grossly intact.  C5-C6 Reflex 2/4 Bilaterally, L4 Reflex 2/4 Bilaterally.  Cerebellar function intact. Skin: No rashes. Psych: Alert and oriented to person/place/time.  Thought process normal. No anxiety/depression.  Assessment & Plan: Joshua Kaiser was seen today for annual exam.  Diagnoses and all orders for this visit:  Annual physical exam -     Lipid panel -     BASIC METABOLIC PANEL WITH GFR   Healthy lifestyle interventions including but not limited to regular exercise, a healthy low fat diet, moderation of salt intake, the dangers  of tobacco/alcohol/recreational drug use, nutrition supplementation, and accident avoidance were discussed with the patient and a handout was provided for future reference.  Return in about 3 months (around 09/20/2015) for Thyroid.

## 2015-06-21 ENCOUNTER — Telehealth: Payer: Self-pay | Admitting: Family Medicine

## 2015-06-21 DIAGNOSIS — F419 Anxiety disorder, unspecified: Secondary | ICD-10-CM

## 2015-06-21 LAB — BASIC METABOLIC PANEL WITH GFR
BUN: 10 mg/dL (ref 7–25)
CALCIUM: 9.3 mg/dL (ref 8.6–10.3)
CHLORIDE: 102 mmol/L (ref 98–110)
CO2: 25 mmol/L (ref 20–31)
CREATININE: 0.86 mg/dL (ref 0.60–1.35)
GFR, Est African American: >89 mL/min (ref >=60)
GFR, Est Non African American: >89 mL/min (ref >=60)
Glucose, Bld: 87 mg/dL (ref 65–99)
POTASSIUM: 4.3 mmol/L (ref 3.5–5.3)
Sodium: 138 mmol/L (ref 135–146)

## 2015-06-21 LAB — LIPID PANEL
CHOL/HDL RATIO: 2.6 ratio (ref <=5.0)
CHOLESTEROL: 103 mg/dL — AB (ref 125–200)
HDL: 39 mg/dL — AB (ref >=40)
LDL Cholesterol: 51 mg/dL (ref <130)
Triglycerides: 67 mg/dL (ref <150)
VLDL: 13 mg/dL (ref <30)

## 2015-06-21 MED ORDER — BUPROPION HCL ER (XL) 150 MG PO TB24: 150.0000 mg | ORAL_TABLET | Freq: Every day | ORAL | Status: DC

## 2015-06-21 MED ORDER — ALPRAZOLAM 1 MG PO TABS: ORAL_TABLET | ORAL | Status: DC

## 2015-06-21 NOTE — Telephone Encounter (Signed)
[name], Patient requestin g refills, Rx in your in box.

## 2015-06-21 NOTE — Telephone Encounter (Signed)
Rx's faxed.

## 2015-08-14 ENCOUNTER — Encounter: Payer: Self-pay | Admitting: Osteopathic Medicine

## 2015-08-14 ENCOUNTER — Other Ambulatory Visit: Payer: Self-pay | Admitting: Family Medicine

## 2015-08-14 ENCOUNTER — Ambulatory Visit (INDEPENDENT_AMBULATORY_CARE_PROVIDER_SITE_OTHER): Payer: Managed Care, Other (non HMO) | Admitting: Osteopathic Medicine

## 2015-08-14 VITALS — BP 136/86 | HR 69 | Wt 212.0 lb

## 2015-08-14 DIAGNOSIS — R0781 Pleurodynia: Secondary | ICD-10-CM

## 2015-08-14 DIAGNOSIS — W1789XA Other fall from one level to another, initial encounter: Secondary | ICD-10-CM

## 2015-08-14 DIAGNOSIS — M542 Cervicalgia: Secondary | ICD-10-CM | POA: Diagnosis not present

## 2015-08-14 MED ORDER — CYCLOBENZAPRINE HCL 10 MG PO TABS: ORAL_TABLET | ORAL | Status: DC

## 2015-08-14 MED ORDER — NAPROXEN 500 MG PO TABS: 500.0000 mg | ORAL_TABLET | Freq: Two times a day (BID) | ORAL | Status: DC

## 2015-08-14 MED ORDER — DICLOFENAC SODIUM 75 MG PO TBEC
75.0000 mg | DELAYED_RELEASE_TABLET | Freq: Two times a day (BID) | ORAL | Status: DC | PRN
Start: 2015-08-14 — End: 2015-11-25

## 2015-08-14 NOTE — Patient Instructions (Signed)
If you're not feeling better in the next week or two, come back to clinic for further evaluation and possible imaging or physical therapy referral.

## 2015-08-14 NOTE — Progress Notes (Signed)
HPI: Joshua Kaiser is a 34 y.o. male who presents to [facility]  today for chief complaint of:  Chief Complaint  Patient presents with  . Fall    last night. hurt neck, rt knee, and rt side    . Location: L ribs and base of skull.neck . Quality: pain/sore . Severity: moderate to severe . Duration: 1 day . Timing: intermittently worse, constantly present . Context: fell from ladder, landed on L side . Modifying factors: took Ibuprofen . Assoc signs/symptoms: No numbness.tingling, some pain with breathing, he has had broken ribs in the past and doen't think this is the problem now. No headache/vision change, no weakness   Past medical, social and family history reviewed: Past Medical History  Diagnosis Date  . Hypothyroidism 11/04/2012  . Anxiety 11/04/2012  . Right knee meniscal tear 11/04/2012  . Right knee meniscal tear 11/04/2012   Past Surgical History  Procedure Laterality Date  . Vasectomy  08/26/2012   Social History  Substance Use Topics  . Smoking status: Former Smoker    Types: Cigarettes  . Smokeless tobacco: Not on file  . Alcohol Use: No   Family History  Problem Relation Age of Onset  . Hypertension Father   . Depression Mother     Current Outpatient Prescriptions  Medication Sig Dispense Refill  . ALPRAZolam (XANAX) 1 MG tablet TAKE ONE TABLET AT BEDTIME AS NEEDED FOR SLEEP 30 tablet 1  . AMBULATORY NON FORMULARY MEDICATION Auto-CPAP with initial pressure of 6cm H20.  Dx: Moderate Obstructive Sleep Apnea 327.23 1 Units 0  . buPROPion (WELLBUTRIN XL) 150 MG 24 hr tablet Take 1 tablet (150 mg total) by mouth daily. 90 tablet 2  . diclofenac (VOLTAREN) 75 MG EC tablet Take 1 tablet (75 mg total) by mouth 2 (two) times daily as needed. For pain. 60 tablet 3  . levothyroxine (SYNTHROID, LEVOTHROID) 137 MCG tablet TAKE 1 TABLET EVERY DAY 90 tablet 2   No current facility-administered medications for this visit.   No  Known Allergies    Review of Systems: CONSTITUTIONAL: Neg fever/chills, no unintentional weight changes HEAD/EYES/EARS/NOSE/THROAT: No headache/vision change or hearing change, no sore throat CARDIAC: No chest pain RESPIRATORY: No cough/shortness of breath MUSCULOSKELETAL: (+) myalgia/arthralgia as per HPI SKIN: No rash/wounds/concerning lesions/bruise NEUROLOGIC: No weakness/dizzines  Exam:  BP 136/86 mmHg  Pulse 69  Wt 212 lb (96.163 kg)  SpO2 97% Constitutional: VSS, see above. General Appearance: alert, well-developed, well-nourished, NAD Eyes: Normal lids and conjunctive, non-icteric sclera,  Neck: No masses, trachea midline. No thyroid enlargement/tenderness/mass appreciated Respiratory: Normal respiratory effort. , ribs normal alignment Musculoskeletal: Gait normal. No clubbing/cyanosis of digits. (+) paraspineal tenderness L lower-T and upper-L spine, limited rotation spine, neck paraspinal tenderness, no midline tenderness Neurological: No cranial nerve deficit on limited exam. Motor and sensation intact and symmetric   No results found for this or any previous visit (from the past 72 hour(s)).    ASSESSMENT/PLAN:  Rib pain on left side - Plan: cyclobenzaprine (FLEXERIL) 10 MG tablet, diclofenac (VOLTAREN) 75 MG EC tablet, DISCONTINUED: naproxen (NAPROSYN) 500 MG tablet  Neck pain - Plan: cyclobenzaprine (FLEXERIL) 10 MG tablet, diclofenac (VOLTAREN) 75 MG EC tablet, DISCONTINUED: naproxen (NAPROSYN) 500 MG tablet  Injury resulting from fall from height   Advised rest today, keep moving as tolerated, stretching exercises reviewed,declines XR today to eval fracture but low suspicion for this given hx/exam, RTC if not better 1 - 2 weeks, consider referal to PT/sports med  if no improvement. OMT Myofascial release to C-spine to some relief, T/L spine tenderness prevents OMT being tolerated

## 2015-08-19 ENCOUNTER — Other Ambulatory Visit: Payer: Self-pay | Admitting: *Deleted

## 2015-08-19 ENCOUNTER — Other Ambulatory Visit: Payer: Self-pay | Admitting: Family Medicine

## 2015-08-19 MED ORDER — ALPRAZOLAM 1 MG PO TABS: ORAL_TABLET | ORAL | Status: DC

## 2015-10-15 ENCOUNTER — Encounter: Payer: Self-pay | Admitting: Osteopathic Medicine

## 2015-10-15 ENCOUNTER — Ambulatory Visit (INDEPENDENT_AMBULATORY_CARE_PROVIDER_SITE_OTHER): Payer: Managed Care, Other (non HMO) | Admitting: Osteopathic Medicine

## 2015-10-15 VITALS — BP 136/90 | HR 61 | Ht 72.0 in | Wt 211.0 lb

## 2015-10-15 DIAGNOSIS — M545 Low back pain, unspecified: Secondary | ICD-10-CM

## 2015-10-15 DIAGNOSIS — A879 Viral meningitis, unspecified: Secondary | ICD-10-CM | POA: Diagnosis not present

## 2015-10-15 MED ORDER — OXYCODONE HCL 5 MG PO TABS: 5.0000 mg | ORAL_TABLET | Freq: Four times a day (QID) | ORAL | Status: DC | PRN

## 2015-10-15 NOTE — Progress Notes (Signed)
HPI: Joshua Kaiser is a 34 y.o. male who presents to [facility]  today  for chief complaint of:  Chief Complaint  Patient presents with  . Hospitalization Follow-up    . Location: Back pain at site of lumbar puncture . Quality: Shooting alternating with soreness . Severity: Severe . Context:Patient recently hospitalized and treated for viral meningitis, LP on 10/09/15 six days ago, today presents to clinic complaining of back pain at site of lumbar puncture. . Modifying factors:Patient was given oxycodone for pain upon discharge from the hospital but he is out of this, has tried multiple doses of ibuprofen today and it is not helping. . Assoc signs/symptoms: headache overall improved, no vision changes, no dizziness, no numbness in legs or difficulty walking, no fever.    Past medical, social and family history reviewed: Past Medical History  Diagnosis Date  . Hypothyroidism 11/04/2012  . Anxiety 11/04/2012  . Right knee meniscal tear 11/04/2012  . Right knee meniscal tear 11/04/2012   Past Surgical History  Procedure Laterality Date  . Vasectomy  08/26/2012   Social History  Substance Use Topics  . Smoking status: Former Smoker    Types: Cigarettes  . Smokeless tobacco: Not on file  . Alcohol Use: No   Family History  Problem Relation Age of Onset  . Hypertension Father   . Depression Mother     Current Outpatient Prescriptions  Medication Sig Dispense Refill  . ALPRAZolam (XANAX) 1 MG tablet TAKE ONE TABLET AT BEDTIME AS NEEDED FOR SLEEP 30 tablet 1  . AMBULATORY NON FORMULARY MEDICATION Auto-CPAP with initial pressure of 6cm H20.  Dx: Moderate Obstructive Sleep Apnea 327.23 1 Units 0  . buPROPion (WELLBUTRIN XL) 150 MG 24 hr tablet Take 1 tablet (150 mg total) by mouth daily. 90 tablet 2  . levothyroxine (SYNTHROID, LEVOTHROID) 137 MCG tablet TAKE 1 TABLET EVERY DAY 90 tablet 2  . diclofenac (VOLTAREN) 75 MG EC tablet Take 1 tablet  (75 mg total) by mouth 2 (two) times daily as needed. For pain. (Patient not taking: Reported on 10/15/2015) 60 tablet 3  . oxyCODONE (ROXICODONE) 5 MG immediate release tablet Take 1 tablet (5 mg total) by mouth every 6 (six) hours as needed for severe pain. 30 tablet 0   No current facility-administered medications for this visit.   Allergies  Allergen Reactions  . Naproxen Other (See Comments)    "messes with stomach"      Review of Systems: CONSTITUTIONAL:  No  fever, no chills, No  unintentional weight changes HEAD/EYES/EARS/NOSE/THROAT: mild headache, no vision change, no hearing change, No  sore throat, No  sinus pressure CARDIAC: No  chest pain, No  pressure, No palpitations, No  orthopnea RESPIRATORY: No  cough MUSCULOSKELETAL: back pain as per HPI myalgia/arthralgia SKIN: No  rash/wounds/concerning lesions NEUROLOGIC: No  weakness, No  dizziness, No  slurred speech    Exam:  BP 136/90 mmHg  Pulse 61  Ht 6' (1.829 m)  Wt 211 lb (95.709 kg)  BMI 28.61 kg/m2 Constitutional: VS see above. General Appearance: alert, well-developed, well-nourished, NAD Eyes: Normal lids and conjunctive, non-icteric sclera, PERRLA Respiratory: Normal respiratory effort. no wheeze, no rhonchi, no rales Cardiovascular: S1/S2 normal, no murmur, no rub/gallop auscultated. RRR.  Musculoskeletal: Gait normal. No clubbing/cyanosis of digits. Lower back examined, site of lumbar puncture healing normally, no erythema, mild paraspinal tenderness over the area, normal range of motion lower back Neurological: No cranial nerve deficit on limited exam. Motor and  sensation intact and symmetric, no meningeal signs negative  and Brudzinski Skin: warm, dry, intact. No rash/ulcer. Site around the lumbar puncture is normal   No results found for this or any previous visit (from the past 72 hour(s)).    ASSESSMENT/PLAN: Refilled oxycodone, advised, administration with ibuprofen the patient is a bit  hesitant saying that NSAIDs and Tylenol frequently upset his stomach must take these with food and his appetite has been a bit low. Advised try with food, any worsening of pain or headache or any other concerning signs such as fever or vision changes ot other, please let us know, ER precautions reviewed.  Right-sided low back pain without sciatica - s/p lumbar puncture and pain persists  Meningitis, viral   Return 1 - 2 weeks for follow-up with Dr Ivan AnchorsHommel.

## 2015-10-23 ENCOUNTER — Other Ambulatory Visit: Payer: Self-pay | Admitting: Sports Medicine

## 2015-11-25 ENCOUNTER — Encounter: Payer: Self-pay | Admitting: Family Medicine

## 2015-11-25 ENCOUNTER — Ambulatory Visit (INDEPENDENT_AMBULATORY_CARE_PROVIDER_SITE_OTHER): Payer: Managed Care, Other (non HMO) | Admitting: Family Medicine

## 2015-11-25 VITALS — BP 124/81 | HR 75 | Wt 210.0 lb

## 2015-11-25 DIAGNOSIS — A879 Viral meningitis, unspecified: Secondary | ICD-10-CM

## 2015-11-25 DIAGNOSIS — E031 Congenital hypothyroidism without goiter: Secondary | ICD-10-CM

## 2015-11-25 DIAGNOSIS — Z23 Encounter for immunization: Secondary | ICD-10-CM

## 2015-11-25 DIAGNOSIS — F419 Anxiety disorder, unspecified: Secondary | ICD-10-CM

## 2015-11-25 LAB — TSH: TSH: 4.368 u[IU]/mL (ref 0.350–4.500)

## 2015-11-25 LAB — CBC
HCT: 45 % (ref 39.0–52.0)
Hemoglobin: 15.1 g/dL (ref 13.0–17.0)
MCH: 29.2 pg (ref 26.0–34.0)
MCHC: 33.6 g/dL (ref 30.0–36.0)
MCV: 87 fL (ref 78.0–100.0)
MPV: 9.8 fL (ref 8.6–12.4)
PLATELETS: 305 10*3/uL (ref 150–400)
RBC: 5.17 MIL/uL (ref 4.22–5.81)
RDW: 14.2 % (ref 11.5–15.5)
WBC: 5.6 10*3/uL (ref 4.0–10.5)

## 2015-11-25 MED ORDER — ALPRAZOLAM 1 MG PO TABS: ORAL_TABLET | ORAL | Status: DC

## 2015-11-25 MED ORDER — VENLAFAXINE HCL ER 75 MG PO CP24
75.0000 mg | ORAL_CAPSULE | Freq: Every day | ORAL | Status: DC
Start: 2015-11-25 — End: 2015-11-26

## 2015-11-25 MED ORDER — LEVOTHYROXINE SODIUM 137 MCG PO TABS: ORAL_TABLET | ORAL | Status: DC

## 2015-11-25 NOTE — Progress Notes (Signed)
CC: Joshua Kaiser is a 35 y.o. male is here for Medication Refill and Medication Problem   Subjective: HPI:  Follow-up anxiety: Continues to take a Xanax every night before going to bed. He also tells me that he feels like Wellbutrin might not be working. He feels like he has a problem with irritability and stress bother him. Symptoms are present on a daily basis to moderate degree. Worse when at work. Denies any depression or any other mental disturbance.  Follow-up hypothyroidism: Continues to take levothyroxine on a daily basis with 100% compliance. No unintentional weight gain or loss. Denies any gastrointestinal complaints or skin or hair complaints.  He tells me is worried that he might still be suffering from viral meningitis. He denies any further headache or photophobia. Denies fevers or chills. He has difficulty identifying the symptom that is causing this concern. denies any motor or sensory disturbances.   Review Of Systems Outlined In HPI  Past Medical History  Diagnosis Date  . Hypothyroidism 11/04/2012  . Anxiety 11/04/2012  . Right knee meniscal tear 11/04/2012  . Right knee meniscal tear 11/04/2012    Past Surgical History  Procedure Laterality Date  . Vasectomy  08/26/2012   Family History  Problem Relation Age of Onset  . Hypertension Father   . Depression Mother     Social History   Social History  . Marital Status: Married    Spouse Name: N/A  . Number of Children: N/A  . Years of Education: N/A   Occupational History  . Not on file.   Social History Main Topics  . Smoking status: Former Smoker    Types: Cigarettes  . Smokeless tobacco: Not on file  . Alcohol Use: No  . Drug Use: No  . Sexual Activity: Yes   Other Topics Concern  . Not on file   Social History Narrative     Objective: BP 124/81 mmHg  Pulse 75  Wt 210 lb (95.255 kg)  General: Alert and Oriented, No Acute Distress HEENT: Pupils equal, round, reactive to light.  Conjunctivae clear.  Moist mucousmembranes Lungs: Clear to auscultation bilaterally, no wheezing/ronchi/rales.  Comfortable work of breathing. Good air movement. Cardiac: Regular rate and rhythm. Normal S1/S2.  No murmurs, rubs, nor gallops.   Neuro: Cranial nerves II through XII grossly intact. Extremities: No peripheral edema.  Strong peripheral pulses.  Mental Status: No depression, anxiety, nor agitation. Skin: Warm and dry.  Assessment & Plan: [name] was seen today for medication refill and medication problem.  Diagnoses and all orders for this visit:  Anxiety -     venlafaxine XR (EFFEXOR XR) 75 MG 24 hr capsule; Take 1 capsule (75 mg total) by mouth daily with breakfast.  Congenital hypothyroidism without goiter -     levothyroxine (SYNTHROID, LEVOTHROID) 137 MCG tablet; TAKE 1 TABLET EVERY DAY -     TSH  Meningitis, viral -     CBC  Encounter for immunization  Other orders -     ALPRAZolam (XANAX) 1 MG tablet; TAKE ONE TABLET AT BEDTIME AS NEEDED FOR SLEEP -     Cancel: buPROPion (WELLBUTRIN XL) 150 MG 24 hr tablet; Take 1 tablet (150 mg total) by mouth daily. -     Flu Vaccine QUAD 36+ mos IM   Anxiety: Uncontrolled chronic condition stopping Wellbutrin switching to Effexor Hypothyroidism: Clinically controlled to for TSH continue levothyroxine pending results Viral meningitis: Reassurance provided that he should be fully recovered from this without any permanent damage. Discussed  that if he gets a white blood cell count to the normal range this would provide some objective reassurance, he would like to have this test done.  Return in about 6 months (around 05/24/2016).

## 2015-11-26 ENCOUNTER — Telehealth: Payer: Self-pay | Admitting: Family Medicine

## 2015-11-26 MED ORDER — BUPROPION HCL ER (XL) 150 MG PO TB24: 300.0000 mg | ORAL_TABLET | Freq: Every day | ORAL | Status: DC

## 2015-11-26 NOTE — Telephone Encounter (Signed)
Notified. 

## 2015-11-26 NOTE — Telephone Encounter (Signed)
Will you please let patient know that I sent a new Rx of wellbutrin to the [facility] pharmacy.

## 2016-02-14 ENCOUNTER — Other Ambulatory Visit: Payer: Self-pay | Admitting: Family Medicine

## 2016-03-20 ENCOUNTER — Other Ambulatory Visit: Payer: Self-pay | Admitting: Family Medicine

## 2016-03-25 ENCOUNTER — Telehealth: Payer: Self-pay | Admitting: Family Medicine

## 2016-03-25 MED ORDER — DOXYCYCLINE HYCLATE 100 MG PO TABS
ORAL_TABLET | ORAL | Status: AC
Start: 2016-03-25 — End: 2016-04-04

## 2016-03-25 NOTE — Telephone Encounter (Signed)
Patient called left vm that he is still not better and the antibotic he was given 2wks ago is not working and he wanted to come back and see you. Can something else be called in or should come in and see someone else. Thanks

## 2016-03-25 NOTE — Telephone Encounter (Signed)
Stop amoxicillin and switch to doxycycline sent to [facility]

## 2016-03-25 NOTE — Telephone Encounter (Signed)
Pt.notified

## 2016-03-25 NOTE — Telephone Encounter (Signed)
Sx: coughing, wheezing, congestion, sore throat, fatigue, light sinus pressure for the past 3 weeks.  Pt was Rx amoxicillin 500 mg for 10 days twice.

## 2016-03-25 NOTE — Telephone Encounter (Signed)
Raechel Achevonia can you please get more information from [name]. I don't see any record of him being seen in our medical record.  Can he tell me where he went, what he got, and what symptoms he was experiencing?

## 2016-04-15 ENCOUNTER — Other Ambulatory Visit: Payer: Self-pay | Admitting: Family Medicine

## 2016-04-27 ENCOUNTER — Encounter: Payer: Self-pay | Admitting: Osteopathic Medicine

## 2016-04-27 ENCOUNTER — Ambulatory Visit (INDEPENDENT_AMBULATORY_CARE_PROVIDER_SITE_OTHER): Payer: Managed Care, Other (non HMO) | Admitting: Osteopathic Medicine

## 2016-04-27 VITALS — BP 131/83 | HR 76 | Ht 72.0 in | Wt 211.0 lb

## 2016-04-27 DIAGNOSIS — S39012A Strain of muscle, fascia and tendon of lower back, initial encounter: Secondary | ICD-10-CM

## 2016-04-27 MED ORDER — DICLOFENAC SODIUM 75 MG PO TBEC: 75.0000 mg | DELAYED_RELEASE_TABLET | Freq: Two times a day (BID) | ORAL | Status: DC | PRN

## 2016-04-27 MED ORDER — METHYLPREDNISOLONE 4 MG PO TBPK
ORAL_TABLET | ORAL | Status: DC
Start: 2016-04-27 — End: 2016-05-25

## 2016-04-27 MED ORDER — OXYCODONE-ACETAMINOPHEN 5-325 MG PO TABS: 1.0000 | ORAL_TABLET | Freq: Four times a day (QID) | ORAL | Status: DC | PRN

## 2016-04-27 NOTE — Progress Notes (Signed)
HPI: Joshua Kaiser is a 35 y.o. male who presents to [facility] today for chief complaint of:  Chief Complaint  Patient presents with  . Back Pain    Patient stated that he was lifting something yesterday and injured his back.     . Context: was cutting down trees and cutting these up, picked up log ant felt tearing pain  . Location: low back on left side  . Quality: tearing pain at first, now sore  . Duration: yesterday  . Modifying factors: Ibuprofen  . Assoc signs/symptoms: no tingling/numbness in legs/feet    Past medical, social and family history reviewed: Past Medical History  Diagnosis Date  . Hypothyroidism 11/04/2012  . Anxiety 11/04/2012  . Right knee meniscal tear 11/04/2012  . Right knee meniscal tear 11/04/2012   Past Surgical History  Procedure Laterality Date  . Vasectomy  08/26/2012   Social History  Substance Use Topics  . Smoking status: Former Smoker    Types: Cigarettes  . Smokeless tobacco: Not on file  . Alcohol Use: No   Family History  Problem Relation Age of Onset  . Hypertension Father   . Depression Mother     Current Outpatient Prescriptions  Medication Sig Dispense Refill  . ALPRAZolam (XANAX) 1 MG tablet TAKE ONE TABLET AT BEDTIME AS NEEDED FOR SLEEP 30 tablet 0  . levothyroxine (SYNTHROID, LEVOTHROID) 137 MCG tablet TAKE 1 TABLET EVERY DAY 90 tablet 2   No current facility-administered medications for this visit.   Allergies  Allergen Reactions  . Effexor Xr [Venlafaxine Hcl Er]     Felt weird   . Naproxen Other (See Comments)    "messes with stomach"      Review of Systems: CONSTITUTIONAL:  No  fever, no chills, No recent illness CARDIAC: No  chest pain, RESPIRATORY: No  shortness of breath GASTROINTESTINAL: No  nausea, No  vomiting, No  abdominal pain MUSCULOSKELETAL: (+) Left lower back myalgia/arthralgia GENITOURINARY: No  incontinence,  NEUROLOGIC: No  weakness, No saddle  anesthesia   Exam:  BP 131/83 mmHg  Pulse 76  Ht 6' (1.829 m)  Wt 211 lb (95.709 kg)  BMI 28.61 kg/m2 Constitutional: VS see above. General Appearance: alert, well-developed, well-nourished, NAD Respiratory: Normal respiratory effort. no wheeze, no rhonchi, no rales Cardiovascular: S1/S2 normal, no murmur, no rub/gallop auscultated. RRR.  Musculoskeletal: Gait normal. No clubbing/cyanosis of digits. Positive lumbar tenderness on the left, quadratus lumborum and right SI joint. Straight leg raise negative bilaterally, reproduces pain in the back and a bit into the buttock but no radicular pain/sciatica Neurological: No cranial nerve deficit on limited exam. Motor and sensation intact and symmetric. Strength 5 out of 5 both lower extremities Skin: warm, dry, intact. No rash/ulcer Psychiatric: Normal judgment/insight. Normal mood and affect. Oriented x3.    No results found for this or any previous visit (from the past 72 hour(s)).  No results found.   ASSESSMENT/PLAN: Allergies/drug intolerances confirmed, patient called his wife who is a Teacher, early years/pre. Advised can fill prescription for steroids if the pain medications alone are not helping him. He declines injection in the office today of any Toradol or other steroids. No significant radicular pain or alarm symptoms for urgent imaging though can certainly consider physical therapy and possibly MRI if symptoms are failing to improve, based on mechanism of injury and pattern of pain there may be bulging disc but I think more likely at this point lumbar muscle strain. Conservative management, work note  given, patient to call if no better in the next few days and we'll place referral for physical therapy  Lumbar strain, initial encounter - Plan: methylPREDNISolone (MEDROL DOSEPAK) 4 MG TBPK tablet, oxyCODONE-acetaminophen (PERCOCET/ROXICET) 5-325 MG tablet, diclofenac (VOLTAREN) 75 MG EC tablet     All questions were answered. Visit summary with  medication list and pertinent instructions was printed for patient to review. ER/RTC precautions were reviewed with the patient. Return if symptoms worsen or fail to improve - can call us and we can send referral to physical therapy.

## 2016-04-29 ENCOUNTER — Telehealth: Payer: Self-pay

## 2016-04-29 DIAGNOSIS — S39012A Strain of muscle, fascia and tendon of lower back, initial encounter: Secondary | ICD-10-CM

## 2016-04-29 MED ORDER — OXYCODONE-ACETAMINOPHEN 5-325 MG PO TABS: 1.0000 | ORAL_TABLET | Freq: Three times a day (TID) | ORAL | Status: DC | PRN

## 2016-04-29 NOTE — Telephone Encounter (Signed)
Ok to refill pain meds but will not write additional refills after this without office visit or personal discussion with the patient. Be sure he knows to use the other medications first and Percoet sparingly to avoid tolerance, if he hasn't filled the steroid pack yet he should do this and try those meds, too.

## 2016-04-29 NOTE — Telephone Encounter (Signed)
Joshua Kaiser called and states his back pain has not improved. I offered him a referral to PT per last office note. He agreed to have PT but he would also like a few more days of the pain medication. He is not sleeping well because of the pain. Please advise.

## 2016-04-30 MED ORDER — OXYCODONE-ACETAMINOPHEN 5-325 MG PO TABS: 1.0000 | ORAL_TABLET | Freq: Three times a day (TID) | ORAL | Status: DC | PRN

## 2016-04-30 NOTE — Telephone Encounter (Signed)
Left detailed message on patient vm with information as noted below. Also left a message advising patient that Rx is at front desk ready for pickup. Rhonda Cunningham,CMA

## 2016-05-25 ENCOUNTER — Ambulatory Visit (INDEPENDENT_AMBULATORY_CARE_PROVIDER_SITE_OTHER): Payer: Managed Care, Other (non HMO) | Admitting: Family Medicine

## 2016-05-25 ENCOUNTER — Encounter: Payer: Self-pay | Admitting: Family Medicine

## 2016-05-25 VITALS — BP 125/83 | HR 57 | Wt 208.0 lb

## 2016-05-25 DIAGNOSIS — F419 Anxiety disorder, unspecified: Secondary | ICD-10-CM | POA: Diagnosis not present

## 2016-05-25 DIAGNOSIS — E031 Congenital hypothyroidism without goiter: Secondary | ICD-10-CM

## 2016-05-25 LAB — TSH: TSH: 6.36 mIU/L — ABNORMAL HIGH (ref 0.40–4.50)

## 2016-05-25 MED ORDER — ALPRAZOLAM 1 MG PO TABS: ORAL_TABLET | ORAL | Status: DC

## 2016-05-25 NOTE — Progress Notes (Signed)
CC: Joshua FrameMichael Kaiser is a 35 y.o. male is here for Anxiety   Subjective: HPI:   Follow-up anxiety: He's noticed is a little bit more irritable about little things that shouldn't bother him and this is been noticed by himself and his wife over the past few months. He wants to go through with counseling and would like to go to [facility]. He still taking alprazolam at bedtime to go to bed and believes still working without any side effects. He denies any depression.  Follow-up hypothyroidism: Spend 6 months since his TSH was checked last. He is taking levothyroxine with 100% compliance. No unintentional weight loss or gain or skin or hair complaints   Review Of Systems Outlined In HPI  Past Medical History  Diagnosis Date  . Hypothyroidism 11/04/2012  . Anxiety 11/04/2012  . Right knee meniscal tear 11/04/2012  . Right knee meniscal tear 11/04/2012    Past Surgical History  Procedure Laterality Date  . Vasectomy  08/26/2012   Family History  Problem Relation Age of Onset  . Hypertension Father   . Depression Mother     Social History   Social History  . Marital Status: Married    Spouse Name: N/A  . Number of Children: N/A  . Years of Education: N/A   Occupational History  . Not on file.   Social History Main Topics  . Smoking status: Former Smoker    Types: Cigarettes  . Smokeless tobacco: Not on file  . Alcohol Use: No  . Drug Use: No  . Sexual Activity: Yes   Other Topics Concern  . Not on file   Social History Narrative     Objective: BP 125/83 mmHg  Pulse 57  Wt 208 lb (94.348 kg)  Vital signs reviewed. General: Alert and Oriented, No Acute Distress HEENT: Pupils equal, round, reactive to light. Conjunctivae clear.  External ears unremarkable.  Moist mucous membranes. Lungs: Clear and comfortable work of breathing, speaking in full sentences without accessory muscle use. Cardiac: Regular rate and rhythm.  Neuro: CN II-XII grossly intact, gait  normal. Extremities: No peripheral edema.  Strong peripheral pulses.  Mental Status: No depression, anxiety, nor agitation. Logical though process. Skin: Warm and dry.  Assessment & Plan: Joshua Kaiser was seen today for anxiety.  Diagnoses and all orders for this visit:  Anxiety -     ALPRAZolam (XANAX) 1 MG tablet; TAKE ONE TABLET AT BEDTIME AS NEEDED FOR SLEEP -     Ambulatory referral to Psychology  Congenital hypothyroidism without goiter -     TSH  Anxiety: Continue alprazolam on an as-needed basis referral seems reasonable. Hypothyroidism: Clinically controlled due for repeat TSH    Return for CPE After August 11th.

## 2016-05-26 ENCOUNTER — Telehealth: Payer: Self-pay | Admitting: Family Medicine

## 2016-05-26 DIAGNOSIS — E031 Congenital hypothyroidism without goiter: Secondary | ICD-10-CM

## 2016-05-26 MED ORDER — LEVOTHYROXINE SODIUM 150 MCG PO TABS: ORAL_TABLET | ORAL | Status: DC

## 2016-05-26 NOTE — Telephone Encounter (Signed)
Recommendations left on vm 

## 2016-05-26 NOTE — Telephone Encounter (Signed)
E please let patient know that his thyroid supplement appears to be slightly under dosed therefore have sent a slightly higher dose to [facility] [place]. I recommend we check this in 1-2 months again.

## 2016-05-29 ENCOUNTER — Other Ambulatory Visit: Payer: Self-pay

## 2016-05-29 DIAGNOSIS — F419 Anxiety disorder, unspecified: Secondary | ICD-10-CM

## 2016-05-29 MED ORDER — ALPRAZOLAM 1 MG PO TABS: ORAL_TABLET | ORAL | Status: DC

## 2016-07-06 ENCOUNTER — Encounter: Payer: Managed Care, Other (non HMO) | Admitting: Family Medicine

## 2016-07-08 ENCOUNTER — Ambulatory Visit (INDEPENDENT_AMBULATORY_CARE_PROVIDER_SITE_OTHER): Payer: Managed Care, Other (non HMO) | Admitting: Family Medicine

## 2016-07-08 ENCOUNTER — Encounter: Payer: Self-pay | Admitting: Family Medicine

## 2016-07-08 VITALS — BP 117/77 | HR 58 | Wt 201.0 lb

## 2016-07-08 DIAGNOSIS — E031 Congenital hypothyroidism without goiter: Secondary | ICD-10-CM

## 2016-07-08 DIAGNOSIS — Z23 Encounter for immunization: Secondary | ICD-10-CM | POA: Diagnosis not present

## 2016-07-08 DIAGNOSIS — Z0000 Encounter for general adult medical examination without abnormal findings: Secondary | ICD-10-CM

## 2016-07-08 DIAGNOSIS — F419 Anxiety disorder, unspecified: Secondary | ICD-10-CM

## 2016-07-08 LAB — COMPLETE METABOLIC PANEL WITH GFR
ALBUMIN: 4.7 g/dL (ref 3.6–5.1)
ALK PHOS: 35 U/L — AB (ref 40–115)
ALT: 19 U/L (ref 9–46)
AST: 18 U/L (ref 10–40)
BILIRUBIN TOTAL: 1.2 mg/dL (ref 0.2–1.2)
BUN: 14 mg/dL (ref 7–25)
CO2: 22 mmol/L (ref 20–31)
Calcium: 9.5 mg/dL (ref 8.6–10.3)
Chloride: 106 mmol/L (ref 98–110)
Creat: 1 mg/dL (ref 0.60–1.35)
GFR, Est African American: >89 mL/min (ref >=60)
GFR, Est Non African American: >89 mL/min (ref >=60)
GLUCOSE: 84 mg/dL (ref 65–99)
Potassium: 4.4 mmol/L (ref 3.5–5.3)
SODIUM: 139 mmol/L (ref 135–146)
TOTAL PROTEIN: 7.4 g/dL (ref 6.1–8.1)

## 2016-07-08 LAB — CBC
HCT: 45.6 % (ref 38.5–50.0)
HEMOGLOBIN: 15.4 g/dL (ref 13.2–17.1)
MCH: 28.9 pg (ref 27.0–33.0)
MCHC: 33.8 g/dL (ref 32.0–36.0)
MCV: 85.6 fL (ref 80.0–100.0)
MPV: 9.9 fL (ref 7.5–12.5)
PLATELETS: 333 10*3/uL (ref 140–400)
RBC: 5.33 MIL/uL (ref 4.20–5.80)
RDW: 13.8 % (ref 11.0–15.0)
WBC: 5.3 10*3/uL (ref 3.8–10.8)

## 2016-07-08 LAB — LIPID PANEL
CHOLESTEROL: 124 mg/dL — AB (ref 125–200)
HDL: 48 mg/dL (ref >=40)
LDL Cholesterol: 64 mg/dL (ref <130)
Total CHOL/HDL Ratio: 2.6 Ratio (ref <=5.0)
Triglycerides: 61 mg/dL (ref <150)
VLDL: 12 mg/dL (ref <30)

## 2016-07-08 LAB — TSH: TSH: 5.95 mIU/L — ABNORMAL HIGH (ref 0.40–4.50)

## 2016-07-08 MED ORDER — ALPRAZOLAM 1 MG PO TABS: ORAL_TABLET | ORAL | 2 refills | Status: DC

## 2016-07-08 NOTE — Progress Notes (Signed)
CC: Joshua Kaiser is a 35 y.o. male is here for Annual Exam (pt is fasting )   Subjective: HPI:   Influenza Vaccine:  Will receive today Pneumovax:  No current indiction Td/Tdap: UTD from 2011 Zoster: (Start 35 yo)   requestingcomplete physical exam with no complaints  today   Review of Systems - General ROS: negative for - chills, fever, night sweats, weight gain or weight loss Ophthalmic ROS: negative for - decreased vision Psychological ROS: negative for - anxiety or depression ENT ROS: negative for - hearing change, nasal congestion, tinnitus or allergies Hematological and Lymphatic ROS: negative for - bleeding problems, bruising or swollen lymph nodes Breast ROS: negative Respiratory ROS: no cough, shortness of breath, or wheezing Cardiovascular ROS: no chest pain or dyspnea on exertion Gastrointestinal ROS: no abdominal pain, change in bowel habits, or black or bloody stools Genito-Urinary ROS: negative for - genital discharge, genital ulcers, incontinence or abnormal bleeding from genitals Musculoskeletal ROS: negative for - joint pain or muscle pain Neurological ROS: negative for - headaches or memory loss Dermatological ROS: negative for lumps, mole changes, rash and skin lesion changes  Past Medical History:  Diagnosis Date  . Anxiety 11/04/2012  . Hypothyroidism 11/04/2012  . Right knee meniscal tear 11/04/2012  . Right knee meniscal tear 11/04/2012    Past Surgical History:  Procedure Laterality Date  . VASECTOMY  08/26/2012   Family History  Problem Relation Age of Onset  . Hypertension Father   . Depression Mother     Social History   Social History  . Marital status: Married    Spouse name: N/A  . Number of children: N/A  . Years of education: N/A   Occupational History  . Not on file.   Social History Main Topics  . Smoking status: Former Smoker    Types: Cigarettes  . Smokeless tobacco: Not on file  . Alcohol use No  . Drug use: No  .  Sexual activity: Yes   Other Topics Concern  . Not on file   Social History Narrative  . No narrative on file     Objective: BP 117/77   Pulse (!) 58   Wt 201 lb (91.2 kg)   BMI 27.26 kg/m   General: No Acute Distress HEENT: Atraumatic, normocephalic, conjunctivae normal without scleral icterus.  No nasal discharge, hearing grossly intact, TMs with good landmarks bilaterally with no middle ear abnormalities, posterior pharynx clear without oral lesions. Neck: Supple, trachea midline, no cervical nor supraclavicular adenopathy. Pulmonary: Clear to auscultation bilaterally without wheezing, rhonchi, nor rales. Cardiac: Regular rate and rhythm.  No murmurs, rubs, nor gallops. No peripheral edema.  2+ peripheral pulses bilaterally. Abdomen: Bowel sounds normal.  No masses.  Non-tender without rebound.  Negative Murphy's sign. MSK: Grossly intact, no signs of weakness.  Full strength throughout upper and lower extremities.  Full ROM in upper and lower extremities.  No midline spinal tenderness. Neuro: Gait unremarkable, CN II-XII grossly intact.  C5-C6 Reflex 2/4 Bilaterally, L4 Reflex 2/4 Bilaterally.  Cerebellar function intact. Skin: No rashes. Psych: Alert and oriented to person/place/time.  Thought process normal. No anxiety/depression.  Assessment & Plan: Joshua Kaiser was seen today for annual exam.  Diagnoses and all orders for this visit:  Annual physical exam -     TSH -     COMPLETE METABOLIC PANEL WITH GFR -     CBC -     Lipid panel  Congenital hypothyroidism without goiter -     TSH  Anxiety -     ALPRAZolam (XANAX) 1 MG tablet; TAKE ONE TABLET AT BEDTIME AS NEEDED FOR SLEEP  Healthy lifestyle interventions including but not limited to regular exercise, a healthy low fat diet, moderation of salt intake, the dangers of tobacco/alcohol/recreational drug use, nutrition supplementation, and accident avoidance were discussed with the patient and a handout was provided for  future reference.  Discussed with this patient that I will be resigning from my position here with [facility] in September in order to stay with my [place]. I let him know about the providers that are still accepting patients and I feel that this individual will be under great care if he/she stays here with [facility].   Return if symptoms worsen or fail to improve.

## 2016-07-09 ENCOUNTER — Telehealth: Payer: Self-pay | Admitting: Family Medicine

## 2016-07-09 DIAGNOSIS — E031 Congenital hypothyroidism without goiter: Secondary | ICD-10-CM

## 2016-07-09 MED ORDER — LEVOTHYROXINE SODIUM 150 MCG PO TABS: ORAL_TABLET | ORAL | 1 refills | Status: DC

## 2016-07-09 NOTE — Telephone Encounter (Signed)
Will you please let patient know that his thyroid supplement appears to be just barely under-dosed.  I"d recommend continuing to take a 150mcg tablet daily with and additional half tablet on either Saturday or Sunday. Plan to recheck tsh in 2-3 months.  No other lab abnormalities.

## 2016-07-09 NOTE — Telephone Encounter (Signed)
Pt notified and transferred to [facility] for additional questions.

## 2016-09-16 ENCOUNTER — Ambulatory Visit (INDEPENDENT_AMBULATORY_CARE_PROVIDER_SITE_OTHER): Payer: Managed Care, Other (non HMO) | Admitting: Physician Assistant

## 2016-09-16 ENCOUNTER — Encounter: Payer: Self-pay | Admitting: Physician Assistant

## 2016-09-16 VITALS — BP 103/57 | HR 76 | Ht 72.0 in | Wt 207.0 lb

## 2016-09-16 DIAGNOSIS — E039 Hypothyroidism, unspecified: Secondary | ICD-10-CM

## 2016-09-16 DIAGNOSIS — F419 Anxiety disorder, unspecified: Secondary | ICD-10-CM

## 2016-09-16 MED ORDER — ALPRAZOLAM 1 MG PO TABS: ORAL_TABLET | ORAL | 5 refills | Status: DC

## 2016-09-16 NOTE — Progress Notes (Signed)
   Subjective:    Patient ID: Joshua Kaiser, male    DOB: [birth year], 35 y.o.   MRN: [id number]  HPI Pt is a 35 yo male who presents to the clinic to establish care. He was seeing Dr. Ivan AnchorsHOmmel before he left.   .. Active Ambulatory Problems    Diagnosis Date Noted  . Anxiety 11/04/2012  . Hypothyroidism 11/04/2012  . Right knee meniscal tear 11/04/2012  . 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 Special screening examination for neoplasm of prostate 02/19/2015   Resolved Ambulatory Problems    Diagnosis Date Noted  . No Resolved Ambulatory Problems   Past Medical History:  Diagnosis Date  . Anxiety 11/04/2012  . Hypothyroidism 11/04/2012  . Right knee meniscal tear 11/04/2012  . Right knee meniscal tear 11/04/2012   .Marland Kitchen. Family History  Problem Relation Age of Onset  . Hypertension Father   . Depression Mother    .Marland Kitchen. Social History   Social History  . Marital status: Married    Spouse name: N/A  . Number of children: N/A  . Years of education: N/A   Occupational History  . Not on file.   Social History Main Topics  . Smoking status: Former Smoker    Types: Cigarettes  . Smokeless tobacco: Not on file  . Alcohol use No  . Drug use: No  . Sexual activity: Yes   Other Topics Concern  . Not on file   Social History Narrative  . No narrative on file   He needs refills on xanax. He uses it once a day usually for sleep but no more than one tablet a day. No concerns or complaints.   He takes levothyroxine daily for hypothyroidism. TSH elevated last visit.    Review of Systems  All other systems reviewed and are negative.      Objective:   Physical Exam  Constitutional: He is oriented to person, place, and time. He appears well-developed and well-nourished.  HENT:  Head: Normocephalic and atraumatic.  Neck: Normal range of motion. Neck supple. No thyromegaly present.  Cardiovascular: Normal rate, regular rhythm and normal heart sounds.    Pulmonary/Chest: Effort normal and breath sounds normal.  Neurological: He is alert and oriented to person, place, and time.  Psychiatric: He has a normal mood and affect. His behavior is normal.          Assessment & Plan:  Marland Kitchen.Marland Kitchen.Diagnoses and all orders for this visit:  Hypothyroidism, unspecified type -     TSH  Anxiety -     ALPRAZolam (XANAX) 1 MG tablet; TAKE ONE TABLET AT BEDTIME AS NEEDED FOR SLEEP   Will recheck TSH. Will refill accordingly.   Discussed abuse potential of xanax. Only taking once a day. Refilled for 6 months.   Follow up in 6 months.

## 2016-12-24 ENCOUNTER — Other Ambulatory Visit: Payer: Self-pay | Admitting: Physician Assistant

## 2017-01-06 ENCOUNTER — Ambulatory Visit (INDEPENDENT_AMBULATORY_CARE_PROVIDER_SITE_OTHER): Payer: Managed Care, Other (non HMO) | Admitting: Physician Assistant

## 2017-01-06 VITALS — BP 128/76 | HR 79 | Wt 202.0 lb

## 2017-01-06 DIAGNOSIS — E031 Congenital hypothyroidism without goiter: Secondary | ICD-10-CM | POA: Diagnosis not present

## 2017-01-06 DIAGNOSIS — R5383 Other fatigue: Secondary | ICD-10-CM | POA: Diagnosis not present

## 2017-01-06 DIAGNOSIS — F419 Anxiety disorder, unspecified: Secondary | ICD-10-CM | POA: Diagnosis not present

## 2017-01-06 NOTE — Progress Notes (Addendum)
Subjective:     Patient ID: Joshua FrameMichael Kaiser, male   DOB: 1980-12-23, 36 y.o.   MRN: [id number]  HPI  This is a 36 year old male who presents to get his thyroid rechecked. Patient is currently taking 150 mcg Levothyroxine first thing in the morning 30 minutes before food or other medications. He denies constipation, dry skin, brittle nails, weight gain. Endorses some lack of energy and difficulty motivating himself to be active. Last TSH checked 06/28/16 and was 5.95, states he did not have time to [place]. No other complaints today.  Review of Systems All other ROS negative except those noted in the HPI.    Objective:   Physical Exam  Constitutional: He is oriented to person, place, and time. He appears well-developed and well-nourished.  HENT:  Head: Normocephalic and atraumatic.  Neck: Normal range of motion. Neck supple. No tracheal deviation present. No thyromegaly present.  Cardiovascular: Normal rate, regular rhythm and normal heart sounds.   No murmur heard. Pulmonary/Chest: Effort normal and breath sounds normal. He has no wheezes. He has no rales.  Musculoskeletal: Normal range of motion.  Lymphadenopathy:    He has no cervical adenopathy.  Neurological: He is alert and oriented to person, place, and time.  Skin: Skin is warm and dry.  Psychiatric: He has a normal mood and affect. His behavior is normal.       Assessment/Plan:  Diagnoses and all orders for this visit:  Congenital hypothyroidism without goiter -     TSH -     COMPLETE METABOLIC PANEL WITH GFR  Low energy -     Testosterone  Anxiety  - TSH rechecked today. Given recent history of elevated TSH we may need to increase the dose of Levothyroxine. Will check testosterone. Discussed sleep and agrees to sleeping well. He has CPAP due to sleep apnea.if not using regularly consider nightly adherence.  Encouraged regular exercise.  - rarely uses xanax. Only when needed a few times a month. Does  not need refill.

## 2017-01-07 LAB — TSH: TSH: 1.4 m[IU]/L (ref 0.40–4.50)

## 2017-01-08 LAB — COMPLETE METABOLIC PANEL WITH GFR
ALBUMIN: 4.8 g/dL (ref 3.6–5.1)
ALT: 19 U/L (ref 9–46)
AST: 18 U/L (ref 10–40)
Alkaline Phosphatase: 37 U/L — ABNORMAL LOW (ref 40–115)
BUN: 14 mg/dL (ref 7–25)
CALCIUM: 9.7 mg/dL (ref 8.6–10.3)
CO2: 24 mmol/L (ref 20–31)
Chloride: 104 mmol/L (ref 98–110)
Creat: 0.92 mg/dL (ref 0.60–1.35)
Glucose, Bld: 79 mg/dL (ref 65–99)
POTASSIUM: 4.1 mmol/L (ref 3.5–5.3)
Sodium: 141 mmol/L (ref 135–146)
Total Bilirubin: 1.2 mg/dL (ref 0.2–1.2)
Total Protein: 7.9 g/dL (ref 6.1–8.1)

## 2017-01-08 LAB — TESTOSTERONE: TESTOSTERONE: 430 ng/dL (ref 250–827)

## 2017-01-08 NOTE — Progress Notes (Signed)
Call pt: thyroid is perfect! Please give one year refills.  Cmp stable.  Testosterone is normal range.

## 2017-01-11 ENCOUNTER — Other Ambulatory Visit: Payer: Self-pay | Admitting: *Deleted

## 2017-01-11 DIAGNOSIS — F419 Anxiety disorder, unspecified: Secondary | ICD-10-CM

## 2017-01-11 DIAGNOSIS — E031 Congenital hypothyroidism without goiter: Secondary | ICD-10-CM

## 2017-01-11 MED ORDER — LEVOTHYROXINE SODIUM 150 MCG PO TABS: ORAL_TABLET | ORAL | 3 refills | Status: DC

## 2017-01-19 ENCOUNTER — Ambulatory Visit (INDEPENDENT_AMBULATORY_CARE_PROVIDER_SITE_OTHER): Payer: Managed Care, Other (non HMO) | Admitting: Sports Medicine

## 2017-01-19 ENCOUNTER — Ambulatory Visit (INDEPENDENT_AMBULATORY_CARE_PROVIDER_SITE_OTHER): Payer: Managed Care, Other (non HMO)

## 2017-01-19 DIAGNOSIS — M25512 Pain in left shoulder: Secondary | ICD-10-CM

## 2017-01-19 DIAGNOSIS — S4992XA Unspecified injury of left shoulder and upper arm, initial encounter: Secondary | ICD-10-CM | POA: Diagnosis not present

## 2017-01-19 MED ORDER — HYDROCODONE-ACETAMINOPHEN 5-325 MG PO TABS: 1.0000 | ORAL_TABLET | Freq: Three times a day (TID) | ORAL | 0 refills | Status: DC | PRN

## 2017-01-19 NOTE — Progress Notes (Signed)
   Subjective:    I'm seeing this patient as a consultation for:  Joshua GawJade Breeback, PA-C  CC: Left shoulder pain  HPI: Earlier today this 36 year old male slipped on ice falling directly onto his left shoulder, he had immediate pain, bruising, swelling, and inability to use the shoulder. Right now his pain is most severe over the distal clavicle and acromioclavicular joint, and he notes great weakness  Past medical history:  Negative.  See flowsheet/record as well for more information.  Surgical history: Negative.  See flowsheet/record as well for more information.  Family history: Negative.  See flowsheet/record as well for more information.  Social history: Negative.  See flowsheet/record as well for more information.  Allergies, and medications have been entered into the medical record, reviewed, and no changes needed.   Review of Systems: No headache, visual changes, nausea, vomiting, diarrhea, constipation, dizziness, abdominal pain, skin rash, fevers, chills, night sweats, weight loss, swollen lymph nodes, body aches, joint swelling, muscle aches, chest pain, shortness of breath, mood changes, visual or auditory hallucinations.   Objective:   General: Well Developed, well nourished, and in no acute distress.  Neuro/Psych: Alert and oriented x3, extra-ocular muscles intact, able to move all 4 extremities, sensation grossly intact. Skin: Warm and dry, no rashes noted.  Respiratory: Not using accessory muscles, speaking in full sentences, trachea midline.  Cardiovascular: Pulses palpable, no extremity edema. Abdomen: Does not appear distended. Left Shoulder: Visibly swollen, bruised Exquisite tenderness over the distal clavicle and the acromioclavicular joint ROM is limited by pain Rotator cuff strength weak to external rotation  Impression and Recommendations:   This case required medical decision making of moderate complexity.  Injury of left shoulder Multiple possible  derangements. Suspect clavicular fracture versus before meals separation. We are going to get x-rays of his shoulder, clavicle. He is extremely weak to external rotation concerning for a tear of the infraspinatus, I'm going to go and get an early MRI as well. Sling, Vicodin for pain. Return to see me in 2 weeks.

## 2017-01-19 NOTE — Assessment & Plan Note (Signed)
Multiple possible derangements. Suspect clavicular fracture versus before meals separation. We are going to get x-rays of his shoulder, clavicle. He is extremely weak to external rotation concerning for a tear of the infraspinatus, I'm going to go and get an early MRI as well. Sling, Vicodin for pain. Return to see me in 2 weeks.

## 2017-02-26 ENCOUNTER — Other Ambulatory Visit: Payer: Self-pay | Admitting: *Deleted

## 2017-02-26 DIAGNOSIS — E031 Congenital hypothyroidism without goiter: Secondary | ICD-10-CM

## 2017-02-26 MED ORDER — LEVOTHYROXINE SODIUM 150 MCG PO TABS: ORAL_TABLET | ORAL | 3 refills | Status: DC

## 2017-03-16 ENCOUNTER — Ambulatory Visit: Payer: Managed Care, Other (non HMO) | Admitting: Physician Assistant

## 2017-03-31 ENCOUNTER — Other Ambulatory Visit: Payer: Self-pay | Admitting: Physician Assistant

## 2017-03-31 DIAGNOSIS — F419 Anxiety disorder, unspecified: Secondary | ICD-10-CM

## 2017-05-14 ENCOUNTER — Encounter: Payer: Self-pay | Admitting: Osteopathic Medicine

## 2017-05-14 ENCOUNTER — Ambulatory Visit (INDEPENDENT_AMBULATORY_CARE_PROVIDER_SITE_OTHER): Payer: Managed Care, Other (non HMO) | Admitting: Osteopathic Medicine

## 2017-05-14 VITALS — BP 115/63 | HR 87 | Ht 72.0 in | Wt 211.0 lb

## 2017-05-14 DIAGNOSIS — E031 Congenital hypothyroidism without goiter: Secondary | ICD-10-CM | POA: Diagnosis not present

## 2017-05-14 DIAGNOSIS — F419 Anxiety disorder, unspecified: Secondary | ICD-10-CM | POA: Diagnosis not present

## 2017-05-14 MED ORDER — BUPROPION HCL ER (XL) 150 MG PO TB24
150.0000 mg | ORAL_TABLET | ORAL | 1 refills | Status: DC
Start: 2017-05-14 — End: 2017-07-09

## 2017-05-14 MED ORDER — ALPRAZOLAM 1 MG PO TABS: 1.0000 mg | ORAL_TABLET | Freq: Every day | ORAL | 1 refills | Status: DC | PRN

## 2017-05-14 MED ORDER — LEVOTHYROXINE SODIUM 150 MCG PO TABS: ORAL_TABLET | ORAL | 3 refills | Status: DC

## 2017-05-14 NOTE — Progress Notes (Signed)
HPI: Joshua Kaiser is a 36 y.o. male  who presents to [facility] today, 05/14/17,  for chief complaint of:  Chief Complaint  Patient presents with  . Medication Refill    Anxiety: Has been taking Xanax typically once a day for several years. He does not take this more than once a day. He has been on Wellbutrin in the past and will like to get back on this medication. Has been noticing more irritability/anxiety as well as concentration issues and weight gain. He has a counselor that he is going to be seeing soon, he does not require referral  Also due for refill on thyroid medication, most recent TSH normal and have been stable for some time on current dose of medication.  Past medical history, surgical history, social history and family history reviewed.  Patient Active Problem List   Diagnosis Date Noted  . Injury of left shoulder 01/19/2017  . Low energy 01/06/2017  . Special screening examination for neoplasm of prostate 02/19/2015  . 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 Anxiety 11/04/2012  . Hypothyroidism 11/04/2012  . Right knee meniscal tear 11/04/2012    Current medication list and allergy/intolerance information reviewed.   Current Outpatient Prescriptions on File Prior to Visit  Medication Sig Dispense Refill  . ALPRAZolam (XANAX) 1 MG tablet TAKE ONE TABLET AT BEDTIME AS NEEDED FOR SLEEP 30 tablet 0  . diclofenac (VOLTAREN) 75 MG EC tablet Take 1 tablet (75 mg total) by mouth 2 (two) times daily as needed for mild pain (take with meals). 30 tablet 0  . HYDROcodone-acetaminophen (NORCO/VICODIN) 5-325 MG tablet Take 1-2 tablets by mouth every 8 (eight) hours as needed for moderate pain. 40 tablet 0  . levothyroxine (SYNTHROID, LEVOTHROID) 150 MCG tablet TAKE 1 TABLET EVERY DAY with an additional half tab on Saturday or Sunday. 90 tablet 3   No current facility-administered medications on file prior to visit.    Allergies   Allergen Reactions  . Effexor Xr [Venlafaxine Hcl Er]     Felt weird   . Naproxen Other (See Comments)    "messes with stomach"      Review of Systems:  Constitutional: No recent illness  Cardiac: No  chest pain, No  pressure, No palpitations  Respiratory:  No  shortness of breath.   Psychiatric: No  concerns with depression, +concerns with anxiety  Exam:  BP 115/63   Pulse 87   Ht 6' (1.829 m)   Wt 211 lb (95.7 kg)   SpO2 98%   BMI 28.62 kg/m   Constitutional: VS see above. General Appearance: alert, well-developed, well-nourished, NAD  Neck: No masses, trachea midline.   Respiratory: Normal respiratory effort. n  Musculoskeletal: Gait normal. Symmetric and independent movement of all extremities  Neurological: Normal balance/coordination. No tremor.  Skin: warm, dry, intact.   Psychiatric: Normal judgment/insight. Normal mood and affect. Oriented x3.     ASSESSMENT/PLAN: We'll restart Wellbutrin, patient given refills of alprazolam to last until PCP is back, he is to follow up for further monitoring of anxiety issues on Wellbutrin, let us know sooner if there is anything else we can do to help  Anxiety - Plan: buPROPion (WELLBUTRIN XL) 150 MG 24 hr tablet, ALPRAZolam (XANAX) 1 MG tablet  Congenital hypothyroidism without goiter - Plan: levothyroxine (SYNTHROID, LEVOTHROID) 150 MCG tablet     Follow-up plan: Return in about 6 weeks (around 06/25/2017) for recheck on Wellbutrin with Jade, sooner if needed .  Visit  summary with medication list and pertinent instructions was printed for patient to review, alert us if any changes needed. All questions at time of visit were answered - patient instructed to contact office with any additional concerns. ER/RTC precautions were reviewed with the patient and understanding verbalized.   Note: Total time spent 15 minutes, greater than 50% of the visit was spent face-to-face counseling and coordinating care for the  following: The primary encounter diagnosis was Anxiety. A diagnosis of Congenital hypothyroidism without goiter was also pertinent to this visit..Marland Kitchen

## 2017-07-02 ENCOUNTER — Ambulatory Visit: Payer: Managed Care, Other (non HMO) | Admitting: Physician Assistant

## 2017-07-09 ENCOUNTER — Encounter: Payer: Self-pay | Admitting: Physician Assistant

## 2017-07-09 ENCOUNTER — Ambulatory Visit: Payer: Managed Care, Other (non HMO) | Admitting: Physician Assistant

## 2017-07-09 ENCOUNTER — Ambulatory Visit (INDEPENDENT_AMBULATORY_CARE_PROVIDER_SITE_OTHER): Payer: Managed Care, Other (non HMO) | Admitting: Physician Assistant

## 2017-07-09 VITALS — BP 143/77 | HR 63 | Wt 210.0 lb

## 2017-07-09 DIAGNOSIS — F321 Major depressive disorder, single episode, moderate: Secondary | ICD-10-CM | POA: Diagnosis not present

## 2017-07-09 DIAGNOSIS — R030 Elevated blood-pressure reading, without diagnosis of hypertension: Secondary | ICD-10-CM

## 2017-07-09 DIAGNOSIS — F4310 Post-traumatic stress disorder, unspecified: Secondary | ICD-10-CM | POA: Diagnosis not present

## 2017-07-09 DIAGNOSIS — Z818 Family history of other mental and behavioral disorders: Secondary | ICD-10-CM | POA: Diagnosis not present

## 2017-07-09 MED ORDER — SERTRALINE HCL 50 MG PO TABS: 50.0000 mg | ORAL_TABLET | Freq: Every day | ORAL | 1 refills | Status: DC
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
## 2017-07-11 DIAGNOSIS — Z818 Family history of other mental and behavioral disorders: Secondary | ICD-10-CM

## 2017-07-11 DIAGNOSIS — F321 Major depressive disorder, single episode, moderate: Secondary | ICD-10-CM | POA: Insufficient documentation

## 2017-07-11 DIAGNOSIS — F4310 Post-traumatic stress disorder, unspecified: Secondary | ICD-10-CM | POA: Insufficient documentation

## 2017-07-11 NOTE — Patient Instructions (Signed)
Suicidal Feelings: How to Help Yourself °Suicide is the taking of one's own life. If you feel as though life is getting too tough to handle and are thinking about suicide, get help right away. To get help: °· Call your local emergency services (911 in the [place] °· Call a suicide hotline to speak with a trained counselor who understands how you are feeling. The following is a list of suicide hotlines in the [place]. For a list of hotlines in [place], visit www.suicide.org/hotlines/international/[place]-suicide-hotlines.html. °¨ 1-800-273-TALK (1-800-273-8255). °¨ 1-800-SUICIDE (1-800-784-2433). °¨ 1-888-628-9454. This is a hotline for Spanish speakers. °¨ 1-800-799-4TTY (1-800-799-4889). This is a hotline for TTY users. °¨ 1-866-4-U-TREVOR (1-866-488-7386). This is a hotline for lesbian, gay, bisexual, transgender, or questioning youth. °· Contact a crisis center or a local suicide prevention center. To find a crisis center or suicide prevention center: °¨ Call your local hospital, clinic, community service organization, mental health center, social service provider, or health department. Ask for assistance in connecting to a crisis center. °¨ Visit www.suicidepreventionlifeline.org/getinvolved/locator for a list of crisis centers in the [place], or visit www.suicideprevention.[place]/thinking-about-suicide/find-a-crisis-centre for a list of centers in [place]. °· Visit the following websites: °¨ National Suicide Prevention Lifeline: www.suicidepreventionlifeline.org °¨ Hopeline: www.hopeline.com °¨ American Foundation for Suicide Prevention: www.afsp.org °¨ The Trevor Project (for lesbian, gay, bisexual, transgender, or questioning youth): www.thetrevorproject.org °How can I help myself feel better? °· Promise yourself that you will not do anything drastic when you have suicidal feelings. Remember, there is hope. Many people have gotten through suicidal thoughts and feelings, and you will, too. You may have  gotten through them before, and this proves that you can get through them again. °· Let family, friends, teachers, or counselors know how you are feeling. Try not to isolate yourself from those who care about you. Remember, they will want to help you. Talk with someone every day, even if you do not feel sociable. Face-to-face conversation is best. °· Call a mental health professional and see one regularly. °· Visit your primary health care provider every year. °· Eat a well-balanced diet, and space your meals so you eat regularly. °· Get plenty of rest. °· Avoid alcohol and drugs, and remove them from your home. They will only make you feel worse. °· If you are thinking of taking a lot of medicine, give your medicine to someone who can give it to you one day at a time. If you are on antidepressants and are concerned you will overdose, let your health care provider know so he or she can give you safer medicines. Ask your mental health professional about the possible side effects of any medicines you are taking. °· Remove weapons, poisons, knives, and anything else that could harm you from your home. °· Try to stick to routines. Follow a schedule every day. Put self-care on your schedule. °· Make a list of realistic goals, and cross them off when you achieve them. Accomplishments give a sense of worth. °· Wait until you are feeling better before doing the things you find difficult or unpleasant. °· Exercise if you are able. You will feel better if you exercise for even a half hour each day. °· Go out in the sun or into nature. This will help you recover from depression faster. If you have a favorite place to walk, go there. °· Do the things that have always given you pleasure. Play your favorite music, read a good book, paint a picture, play your favorite instrument, or do anything   else that takes your mind off your depression if it is safe to do. °· Keep your living space well lit. °· When you are feeling well, write  yourself a letter about tips and support that you can read when you are not feeling well. °· Remember that life’s difficulties can be sorted out with help. Conditions can be treated. You can work on thoughts and strategies that serve you well. °This information is not intended to replace advice given to you by your health care provider. Make sure you discuss any questions you have with your health care provider. °Document Released: 05/02/2003 Document Revised: 06/24/2016 Document Reviewed: 02/20/2014 °Elsevier Interactive Patient Education © 2017 [employer] ° °

## 2017-07-16 ENCOUNTER — Other Ambulatory Visit: Payer: Self-pay | Admitting: Osteopathic Medicine

## 2017-07-16 DIAGNOSIS — F419 Anxiety disorder, unspecified: Secondary | ICD-10-CM

## 2017-08-06 ENCOUNTER — Ambulatory Visit: Payer: Managed Care, Other (non HMO)

## 2017-09-11 ENCOUNTER — Other Ambulatory Visit: Payer: Self-pay | Admitting: Physician Assistant

## 2017-09-29 ENCOUNTER — Other Ambulatory Visit: Payer: Self-pay | Admitting: Physician Assistant

## 2017-09-29 DIAGNOSIS — F419 Anxiety disorder, unspecified: Secondary | ICD-10-CM

## 2017-09-29 MED ORDER — ALPRAZOLAM 1 MG PO TABS: ORAL_TABLET | ORAL | 1 refills | Status: DC

## 2017-10-15 ENCOUNTER — Ambulatory Visit: Payer: Managed Care, Other (non HMO) | Admitting: Physician Assistant

## 2017-10-15 ENCOUNTER — Encounter: Payer: Self-pay | Admitting: Physician Assistant

## 2017-10-15 VITALS — BP 123/62 | HR 62 | Ht 72.0 in | Wt 210.0 lb

## 2017-10-15 DIAGNOSIS — E039 Hypothyroidism, unspecified: Secondary | ICD-10-CM | POA: Diagnosis not present

## 2017-10-15 DIAGNOSIS — F321 Major depressive disorder, single episode, moderate: Secondary | ICD-10-CM

## 2017-10-15 DIAGNOSIS — F4310 Post-traumatic stress disorder, unspecified: Secondary | ICD-10-CM | POA: Diagnosis not present

## 2017-10-15 DIAGNOSIS — F419 Anxiety disorder, unspecified: Secondary | ICD-10-CM | POA: Diagnosis not present

## 2017-10-15 MED ORDER — ALPRAZOLAM 1 MG PO TABS: ORAL_TABLET | ORAL | 1 refills | Status: DC

## 2017-10-15 MED ORDER — SERTRALINE HCL 50 MG PO TABS: 50.0000 mg | ORAL_TABLET | Freq: Every day | ORAL | 1 refills | Status: DC

## 2017-10-15 NOTE — Progress Notes (Addendum)
Subjective:    Patient ID: Joshua Kaiser, male    DOB: 03/21/1981, 36 y.o.   MRN: [id number]  [name] Patient is a 36 year old pleasant male who presents to the clinic to follow-up on Zoloft that was started for PTSD and major depression.  Patient feels significantly better with his mood on Zoloft.  He does wish to increase a little for better control.  He denies any suicidal or homicidal thoughts.  He continues with counseling regularly.  He does admit that his libido has decreased and that is of some concern.  He does not wish to change medication at this time.  He continues to use Xanax as needed for anxiety.  He tries not to use it every day.  When he does use that he cuts tablet in half.  Patient is taking his levothyroxine daily.  He denies any complaints or concerns.  He does need levels rechecked.  He does mention how he would like to stop smoking.  He only smokes 1-2 cigarettes about once a week.  His wife hates this.  Even though does not daily he still feels like his an addiction.  He seems to do this more when he is really stressed and overwhelmed.  .. Active Ambulatory Problems    Diagnosis Date Noted  . Anxiety 11/04/2012  . Hypothyroidism 11/04/2012  . Right knee meniscal tear 11/04/2012  . 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 Special screening examination for neoplasm of prostate 02/19/2015  . Low energy 01/06/2017  . Injury of left shoulder 01/19/2017  . 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 PTSD (post-traumatic stress disorder) 07/11/2017  . Family history of schizophrenia 07/11/2017   Resolved Ambulatory Problems    Diagnosis Date Noted  . No Resolved Ambulatory Problems   Past Medical History:  Diagnosis Date  . Anxiety 11/04/2012  . Hypothyroidism 11/04/2012  . Right knee meniscal tear 11/04/2012  . Right knee meniscal tear 11/04/2012          Review of Systems  All other systems reviewed and are negative.      Objective:   Physical Exam  Constitutional: He is oriented to person, place, and time. He appears well-developed and well-nourished.  HENT:  Head: Normocephalic and atraumatic.  Cardiovascular: Normal rate, regular rhythm and normal heart sounds.  Neurological: He is alert and oriented to person, place, and time.  Psychiatric: He has a normal mood and affect. His behavior is normal.          Assessment & Plan:  Marland Kitchen.Marland Kitchen.Joshua Kaiser was seen today for depression, hypothyroidism and anxiety.  Diagnoses and all orders for this visit:  Acquired hypothyroidism -     TSH  Anxiety -     sertraline (ZOLOFT) 50 MG tablet; Take 1 tablet (50 mg total) by mouth daily. Needs follow up appointment with PCP. -     ALPRAZolam (XANAX) 1 MG tablet; TAKE 1 TABLET BY MOUTH EVERY DAY AS NEEDED FOR ANXIETY  Moderate major depression (HCC) -     sertraline (ZOLOFT) 50 MG tablet; Take 1 tablet (50 mg total) by mouth daily. Needs follow up appointment with PCP.  PTSD (post-traumatic stress disorder) -     sertraline (ZOLOFT) 50 MG tablet; Take 1 tablet (50 mg total) by mouth daily. Needs follow up appointment with PCP.     .. [facility] 07/09/2017  Decreased Interest 0 1  Down, Depressed, Hopeless 0 2  PHQ - 2 Score 0 3  Altered sleeping 1  1  Tired, decreased energy 1 1  Change in appetite 1 2  Feeling bad or failure about yourself  0 2  Trouble concentrating 1 2  Moving slowly or fidgety/restless 0 0  Suicidal thoughts 0 1  PHQ-9 Score 4 12   .Marland Kitchen. GAD 7 : Generalized Anxiety Score 10/15/2017  Nervous, Anxious, on Edge 2  Control/stop worrying 2  Worry too much - different things 2  Trouble relaxing 1  Restless 1  Easily annoyed or irritable 2  Afraid - awful might happen 0  Total GAD 7 Score 10    TSH ordered and will adjust accordingly.   I am very pleased with his Zoloft response.  I am okay with the increase.  We decided to hold on sending a prescription.  He will use his 50 mg  tablets and take 1-1/2.  This will equal 75 mg a day.  We will see if he gets better control with this dosage.  At this point he does not want to switch due to the loss of libido.  Told him to consider a medication Viibryd in the future.  Handout was given.  This medication could give him adequate symptom relief and not affect his libido.  He will call back and let us know how increasing Zoloft is doing and if he is interested in the Viibryd switch.  Continue with counseling regularly.  Xanax refilled for as needed usage.  Continue to discuss to use sparingly.  Discussed finding something else to replace occasional cigs such as gum/straws/candy. Pt cannot tolerate taste of nicorette gum.   Follow-up in 6 months or with any new or worsening symptoms.   Marland Kitchen..Spent 30 minutes with patient and greater than 50 percent of visit spent counseling patient regarding treatment plan.

## 2017-10-15 NOTE — Patient Instructions (Signed)
vibryd to consider.  Vilazodone oral tablet What is this medicine? VILAZODONE (vil [place] oh done) is used to treat depression. This medicine may be used for other purposes; ask your health care provider or pharmacist if you have questions. COMMON BRAND NAME(S): VIIBRYD What should I tell my health care provider before I take this medicine? They need to know if you have any of these conditions: -bipolar disorder or a family history of bipolar disorder -glaucoma -liver disease -low levels of sodium in the blood -receiving electroconvulsive therapy -seizures (convulsions) -suicidal thoughts, plans, or attempt by you or a family member -an unusual or allergic reaction to vilazodone, other medicines, foods, dyes or preservatives -pregnant or trying to get pregnant -breast-feeding How should I use this medicine? Take this medicine by mouth with a glass of water. Follow the directions on the prescription label. Take this medicine with food. Take your medicine at regular intervals. Do not take your medicine more often than directed. Do not stop taking this medicine suddenly except upon the advice of your doctor. Stopping this medicine too quickly may cause serious side effects or your condition may worsen. A special MedGuide will be given to you by the pharmacist with each prescription and refill. Be sure to read this information carefully each time. Overdosage: If you think you have taken too much of this medicine contact a poison control center or emergency room at once. NOTE: This medicine is only for you. Do not share this medicine with others. What if I miss a dose? If you miss a dose, take it as soon as you can. If it is almost time for your next dose, take only that dose. Do not take double or extra doses. What may interact with this medicine? Do not take this medicine with any of the following medications: -linezolid -MAOIs like Carbex, Eldepryl, Marplan, Nardil, and Parnate -methylene  blue (injected into a vein) This medicine may also interact with the following medications: -amphetamines -aspirin and aspirin-like medicines -buspirone -certain diet drugs like dexfenfluramine, fenfluramine, phentermine, sibutramine -certain migraine headache medicines like almotriptan, eletriptan, frovatriptan, naratriptan, rizatriptan, sumatriptan, zolmitriptan -certain medicines that treat or prevent blood clots like warfarin, enoxaparin, and dalteparin -certain medicines that treat infections like clarithromycin, itraconazole, voriconazole, ketoconazole, rifampin -certain medicines that treat seizures like carbamazepine and phenytoin -digoxin -fentanyl -lithium -NSAIDS, medicines for pain and inflammation, like ibuprofen or naproxen -other medicines for depression, anxiety, or psychotic disturbances -St. John's Wort -tramadol -tryptophan This list may not describe all possible interactions. Give your health care provider a list of all the medicines, herbs, non-prescription drugs, or dietary supplements you use. Also tell them if you smoke, drink alcohol, or use illegal drugs. Some items may interact with your medicine. What should I watch for while using this medicine? Tell your doctor if your symptoms do not get better or if they get worse. Visit your doctor or health care professional for regular checks on your progress. Because it may take several weeks to see the full effects of this medicine, it is important to continue your treatment as prescribed by your doctor. Patients and their families should watch out for new or worsening thoughts of suicide or depression. Also watch out for sudden changes in feelings such as feeling anxious, agitated, panicky, irritable, hostile, aggressive, impulsive, severely restless, overly excited and hyperactive, or not being able to sleep. If this happens, especially at the beginning of treatment or after a change in dose, call your health care  professional. You may get  drowsy or dizzy. Do not drive, use machinery, or do anything that needs mental alertness until you know how this medicine affects you. Do not stand or sit up quickly, especially if you are an older patient. This reduces the risk of dizzy or fainting spells. Alcohol may interfere with the effect of this medicine. Avoid alcoholic drinks. Your mouth may get dry. Chewing sugarless gum or sucking hard candy, and drinking plenty of water may help. Contact your doctor if the problem does not go away or is severe. What side effects may I notice from receiving this medicine? Side effects that you should report to your doctor or health care professional as soon as possible: -allergic reactions like skin rash, itching or hives, swelling of the face, lips, or tongue -anxious -black, tarry stools -changes in vision -confusion -elevated mood, decreased need for sleep, racing thoughts, impulsive behavior -eye pain -fast, irregular heartbeat -feeling faint or lightheaded, falls -feeling agitated, angry, or irritable -hallucination, loss of contact with reality -loss of balance or coordination -loss of memory -restlessness, pacing, inability to keep still -seizures -stiff muscles -suicidal thoughts or other mood changes -trouble sleeping -unusual bleeding or bruising -unusually weak or tired -vomiting Side effects that usually do not require medical attention (report to your doctor or health care professional if they continue or are bothersome): -change in appetite or weight -change in sex drive or performance -diarrhea -drowsiness -dry mouth -increased sweating -nausea -tremors This list may not describe all possible side effects. Call your doctor for medical advice about side effects. You may report side effects to FDA at 1-800-FDA-1088. Where should I keep my medicine? Keep out of the reach of children. Store at room temperature between 15 and 30 degrees C (59 to 86  degrees F). Throw away any unused medicine after the expiration date. NOTE: This sheet is a summary. It may not cover all possible information. If you have questions about this medicine, talk to your doctor, pharmacist, or health care provider.  2018 Elsevier/Gold Standard (2016-06-23 12:50:48)

## 2017-11-07 ENCOUNTER — Other Ambulatory Visit: Payer: Self-pay | Admitting: Physician Assistant

## 2017-12-23 ENCOUNTER — Telehealth: Payer: Self-pay

## 2017-12-23 DIAGNOSIS — F4310 Post-traumatic stress disorder, unspecified: Secondary | ICD-10-CM

## 2017-12-23 MED ORDER — RISPERIDONE 0.5 MG PO TABS
0.5000 mg | ORAL_TABLET | Freq: Every day | ORAL | 3 refills | Status: DC
Start: 2017-12-23 — End: 2017-12-24

## 2017-12-23 NOTE — Telephone Encounter (Signed)
Probably needs more Risperdal if he is having these outbursts of anger, and Chaney with the sertraline right now and I am going to add low-dose Risperdal, I would also like to get a second opinion from psychiatry downstairs.

## 2017-12-23 NOTE — Telephone Encounter (Signed)
Joshua Kaiser called and states Sertraline is no longer helping with his PTSD. He feels more depressed and is having out bursts of anger. He is not going to hurt himself or anyone else but he just gets very angry. He has tried Effexor in the past and it helped for a while then it stopped working. He has always had side effects from the Sertraline, stomach pains and nausea but he continued to take the medication because it was helping is anger. Jade did mention, in last office visit, switching patient to Viibryd if the Sertraline stopped working. Please advise.

## 2017-12-24 MED ORDER — VILAZODONE HCL 10 & 20 MG PO KIT
1.0000 | PACK | Freq: Every day | ORAL | 0 refills | Status: DC
Start: 2017-12-24 — End: 2017-12-24

## 2017-12-24 MED ORDER — VILAZODONE HCL 10 MG PO TABS: 10.0000 mg | ORAL_TABLET | Freq: Every day | ORAL | 0 refills | Status: DC

## 2017-12-24 MED ORDER — VILAZODONE HCL 20 MG PO TABS: 1.0000 | ORAL_TABLET | Freq: Every day | ORAL | 3 refills | Status: DC

## 2017-12-24 NOTE — Telephone Encounter (Signed)
Ok (lawd...)

## 2017-12-24 NOTE — Telephone Encounter (Signed)
Wife called back, again, and states the insurance will not pay for the starter pack of Viibryd and the pharmacy doesn't carry the starter pack. She would like Viibryd single dose called in and titrate up as needed. Please advise.

## 2017-12-24 NOTE — Telephone Encounter (Signed)
Viibryd it is.  D/C risperdal.  Still need assistance from psych.

## 2017-12-24 NOTE — Telephone Encounter (Signed)
Pt's wife called and based on their discussion with PCP at last OV she wants him to try the Viibryd. They have a savings card and want it sent instead of the Risperdal. Went over the reasoning as to why this Rx was sent instead, they are still requesting the Viibryd.   Clinical note- Pt's wife to be contacted with update at her work. [phone]:[phone]

## 2017-12-24 NOTE — Telephone Encounter (Signed)
Pt's wife advised. Risperdal Rx cancelled.

## 2017-12-24 NOTE — Telephone Encounter (Signed)
Patient's wife advised

## 2017-12-30 NOTE — Telephone Encounter (Signed)
Pt called to advise that a referral was placed by Dr T for him to go to Psych. Pt states he has talked about this with PCP before and does not want to go to Psych.  Update since Pt has been on Viibryd for about 1 week now. He is on the 10mg  dose, will increase to the 20mg  dose tomorrow. He reports some vertigo if he turns his head too fast, and some trouble falling asleep. Once asleep, he does not wake up and feels like he does rest well. The Rx just seems to give him "more energy." Pt reports the anger outburst have subsided and reports feeling "overall so much better." He feels he can "think clearly" and is "not nearly as anxious."

## 2017-12-31 NOTE — Telephone Encounter (Signed)
Left VM with recommendation. Callback provided for scheduling.  

## 2017-12-31 NOTE — Telephone Encounter (Signed)
Seems like he is doing some better. Lets get him on schedule for follow up when been on rx for one month

## 2018-01-08 ENCOUNTER — Other Ambulatory Visit: Payer: Self-pay | Admitting: Physician Assistant

## 2018-01-28 ENCOUNTER — Other Ambulatory Visit: Payer: Self-pay

## 2018-01-28 DIAGNOSIS — F419 Anxiety disorder, unspecified: Secondary | ICD-10-CM

## 2018-01-28 MED ORDER — ALPRAZOLAM 1 MG PO TABS: ORAL_TABLET | ORAL | 1 refills | Status: DC

## 2018-01-28 NOTE — Telephone Encounter (Signed)
Wife called and left a message for a refill request of Xanax.

## 2018-04-01 ENCOUNTER — Other Ambulatory Visit: Payer: Self-pay | Admitting: *Deleted

## 2018-04-01 DIAGNOSIS — F419 Anxiety disorder, unspecified: Secondary | ICD-10-CM

## 2018-04-01 MED ORDER — ALPRAZOLAM 1 MG PO TABS: ORAL_TABLET | ORAL | 0 refills | Status: DC

## 2018-04-15 ENCOUNTER — Ambulatory Visit: Payer: Managed Care, Other (non HMO) | Admitting: Physician Assistant

## 2018-04-15 ENCOUNTER — Encounter: Payer: Self-pay | Admitting: Physician Assistant

## 2018-04-15 VITALS — BP 110/70 | HR 71 | Ht 72.0 in | Wt 210.0 lb

## 2018-04-15 DIAGNOSIS — F419 Anxiety disorder, unspecified: Secondary | ICD-10-CM

## 2018-04-15 DIAGNOSIS — E039 Hypothyroidism, unspecified: Secondary | ICD-10-CM | POA: Diagnosis not present

## 2018-04-15 DIAGNOSIS — E031 Congenital hypothyroidism without goiter: Secondary | ICD-10-CM

## 2018-04-15 MED ORDER — ALPRAZOLAM 1 MG PO TABS: ORAL_TABLET | ORAL | 5 refills | Status: DC
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
## 2018-04-16 LAB — COMPLETE METABOLIC PANEL WITH GFR
AG Ratio: 1.8 (calc) (ref 1.0–2.5)
ALT: 19 U/L (ref 9–46)
AST: 14 U/L (ref 10–40)
Albumin: 4.5 g/dL (ref 3.6–5.1)
Alkaline phosphatase (APISO): 35 U/L — ABNORMAL LOW (ref 40–115)
BUN: 14 mg/dL (ref 7–25)
CALCIUM: 9.5 mg/dL (ref 8.6–10.3)
CO2: 25 mmol/L (ref 20–32)
Chloride: 106 mmol/L (ref 98–110)
Creat: 0.89 mg/dL (ref 0.60–1.35)
GFR, EST AFRICAN AMERICAN: 127 mL/min/{1.73_m2} (ref >=60)
GFR, EST NON AFRICAN AMERICAN: 109 mL/min/{1.73_m2} (ref >=60)
GLOBULIN: 2.5 g/dL (ref 1.9–3.7)
Glucose, Bld: 89 mg/dL (ref 65–99)
Potassium: 4.2 mmol/L (ref 3.5–5.3)
SODIUM: 137 mmol/L (ref 135–146)
TOTAL PROTEIN: 7 g/dL (ref 6.1–8.1)
Total Bilirubin: 1.3 mg/dL — ABNORMAL HIGH (ref 0.2–1.2)

## 2018-04-16 LAB — TSH: TSH: 0.5 mIU/L (ref 0.40–4.50)

## 2018-04-18 ENCOUNTER — Encounter: Payer: Self-pay | Admitting: Physician Assistant

## 2018-04-18 DIAGNOSIS — R17 Unspecified jaundice: Secondary | ICD-10-CM | POA: Insufficient documentation

## 2018-04-18 MED ORDER — LEVOTHYROXINE SODIUM 150 MCG PO TABS: ORAL_TABLET | ORAL | 1 refills | Status: DC

## 2018-04-18 NOTE — Addendum Note (Signed)
Addended by: Jomarie LongsBREEBACK, Altagracia Rone L on: 04/18/2018 01:33 PM   Modules accepted: Orders

## 2018-04-18 NOTE — Progress Notes (Signed)
Call pt: thyroid still in normal range but has decreased(meaning a little more hyperactive) in the last year. Lets recheck in 3 months. Will send refills.   To note bilirubin increased just a hair. Not concerned but do need to keep eye on it. Lets recheck in 3 months.

## 2018-10-14 ENCOUNTER — Encounter: Payer: Self-pay | Admitting: Physician Assistant

## 2018-10-14 ENCOUNTER — Ambulatory Visit (INDEPENDENT_AMBULATORY_CARE_PROVIDER_SITE_OTHER): Payer: Managed Care, Other (non HMO) | Admitting: Physician Assistant

## 2018-10-14 VITALS — BP 140/91 | HR 84 | Ht 72.0 in | Wt 201.0 lb

## 2018-10-14 DIAGNOSIS — F419 Anxiety disorder, unspecified: Secondary | ICD-10-CM

## 2018-10-14 DIAGNOSIS — F321 Major depressive disorder, single episode, moderate: Secondary | ICD-10-CM | POA: Diagnosis not present

## 2018-10-14 DIAGNOSIS — E031 Congenital hypothyroidism without goiter: Secondary | ICD-10-CM

## 2018-10-14 DIAGNOSIS — F4310 Post-traumatic stress disorder, unspecified: Secondary | ICD-10-CM

## 2018-10-14 MED ORDER — VILAZODONE HCL 20 MG PO TABS: ORAL_TABLET | ORAL | 1 refills | Status: DC

## 2018-10-14 MED ORDER — ALPRAZOLAM 1 MG PO TABS: ORAL_TABLET | ORAL | 5 refills | Status: DC

## 2018-10-14 MED ORDER — LEVOTHYROXINE SODIUM 150 MCG PO TABS: ORAL_TABLET | ORAL | 1 refills | Status: DC
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
## 2018-11-22 ENCOUNTER — Encounter: Payer: Self-pay | Admitting: Physician Assistant

## 2018-11-22 DIAGNOSIS — L509 Urticaria, unspecified: Secondary | ICD-10-CM

## 2019-02-16 ENCOUNTER — Encounter: Payer: Self-pay | Admitting: Physician Assistant

## 2019-02-16 ENCOUNTER — Encounter: Payer: Self-pay | Admitting: Family Medicine

## 2019-02-16 ENCOUNTER — Telehealth (INDEPENDENT_AMBULATORY_CARE_PROVIDER_SITE_OTHER): Payer: Managed Care, Other (non HMO) | Admitting: Family Medicine

## 2019-02-16 VITALS — Ht 72.0 in

## 2019-02-16 DIAGNOSIS — H1031 Unspecified acute conjunctivitis, right eye: Secondary | ICD-10-CM

## 2019-02-16 MED ORDER — POLYMYXIN B-TRIMETHOPRIM 10000-0.1 UNIT/ML-% OP SOLN: 2.0000 [drp] | Freq: Four times a day (QID) | OPHTHALMIC | 0 refills | Status: DC

## 2019-02-16 MED ORDER — OLOPATADINE HCL 0.2 % OP SOLN: 1.0000 [drp] | Freq: Every day | OPHTHALMIC | 99 refills | Status: DC

## 2019-02-16 NOTE — Telephone Encounter (Signed)
Think we could do this as virtual?

## 2019-02-16 NOTE — Progress Notes (Signed)
Virtual Visit via Video Note  I connected with Joshua Kaiser on 02/16/19 at  3:00 PM EDT by a video enabled telemedicine application and verified that I am speaking with the correct person using two identifiers.   I discussed the limitations of evaluation and management by telemedicine and the availability of in person appointments. The patient expressed understanding and agreed to proceed.  Subjective:    CC:   HPI:  Spoke w/pt and he reports that about 2 wks ago his L eye began to bother him it got red and watery. This did get better and then put in some new contactss and then all the sudden got pain in his R eye became.  Blood shot red he states that this is still red he said that he has had to stop wearing his contacts and only wear his glasses since this started. Eye just kept watering.  Eyes feel super sensitive to light, etc. He tried some allergy eye drops that he had before,(Pazeo, Alaway) both prescribed for him and in date but didn't seem to help. The drops actually stung. He had an eye appt last Friday 02/10/2019 but this was cancelled due to COVID-19.  He has experienced some headaches and some visual disturbances because of this. He did inform me that his prescription for his glasses are older than his contacts which makes it a little more difficult for him visually. No crusting or drinage. Some ches congestin from allergies.  Denies any foreign body in the eye.     Past medical history, Surgical history, Family history not pertinant except as noted below, Social history, Allergies, and medications have been entered into the medical record, reviewed, and corrections made.   Review of Systems: No fevers, chills, night sweats, weight loss, chest pain, or shortness of breath.   Objective:    General: Speaking clearly in complete sentences without any shortness of breath.  Alert and oriented x3.  Normal judgment. No apparent acute distress. Right eye has injected conjunctiva..      Impression and Recommendations:    Right eye conjunctivitis - will tx with polymyxin drops and allergy drop. Call if not better in 5 days.  If change in vision please call eye doc back for appt. Continue to avoid wearing contact lenses for now.        I discussed the assessment and treatment plan with the patient. The patient was provided an opportunity to ask questions and all were answered. The patient agreed with the plan and demonstrated an understanding of the instructions.   The patient was advised to call back or seek an in-person evaluation if the symptoms worsen or if the condition fails to improve as anticipated.   Nani Gasser, MD

## 2019-02-16 NOTE — Progress Notes (Signed)
Spoke w/pt and he reports that about 2 wks ago his L eye began to bother him it got red and watery. This did get better but a few days later his R eye became Blood shot red he states that this is still red he said that he has had to stop wearing his contacts and only wear his glasses since this started. He tried some allergy eye drops that he had before,(Pazeo, Alaway) both prescribed for him and in date but didn't seem to help. He had an eye appt last Friday 02/10/2019 but this was cancelled due to COVID-19.  He has experienced some headaches and some visual disturbances because of this. He did inform me that his prescription for his glasses are older than his contacts which makes it a little more difficult for him visually.   Laureen Ochs, Viann Shove, CMA

## 2019-02-16 NOTE — Telephone Encounter (Signed)
Per Linford Arnold, OK to schedule virtual visit. Appt made for this afternoon.

## 2019-02-16 NOTE — Telephone Encounter (Signed)
Pt scheduled for virtual visit today.  

## 2019-04-11 ENCOUNTER — Other Ambulatory Visit: Payer: Self-pay | Admitting: Physician Assistant

## 2019-04-11 DIAGNOSIS — F419 Anxiety disorder, unspecified: Secondary | ICD-10-CM

## 2019-04-11 NOTE — Telephone Encounter (Signed)
Patient has appt Friday for follow up. Please advise on refill.

## 2019-04-14 ENCOUNTER — Ambulatory Visit (INDEPENDENT_AMBULATORY_CARE_PROVIDER_SITE_OTHER): Payer: Managed Care, Other (non HMO) | Admitting: Physician Assistant

## 2019-04-14 ENCOUNTER — Other Ambulatory Visit: Payer: Self-pay

## 2019-04-14 ENCOUNTER — Encounter: Payer: Self-pay | Admitting: Physician Assistant

## 2019-04-14 ENCOUNTER — Ambulatory Visit (INDEPENDENT_AMBULATORY_CARE_PROVIDER_SITE_OTHER): Payer: Managed Care, Other (non HMO)

## 2019-04-14 VITALS — BP 127/72 | HR 74 | Ht 72.0 in | Wt 188.0 lb

## 2019-04-14 DIAGNOSIS — F3341 Major depressive disorder, recurrent, in partial remission: Secondary | ICD-10-CM

## 2019-04-14 DIAGNOSIS — N50812 Left testicular pain: Secondary | ICD-10-CM

## 2019-04-14 DIAGNOSIS — Z131 Encounter for screening for diabetes mellitus: Secondary | ICD-10-CM | POA: Diagnosis not present

## 2019-04-14 DIAGNOSIS — Z9852 Vasectomy status: Secondary | ICD-10-CM | POA: Insufficient documentation

## 2019-04-14 DIAGNOSIS — Z13220 Encounter for screening for lipoid disorders: Secondary | ICD-10-CM

## 2019-04-14 DIAGNOSIS — E039 Hypothyroidism, unspecified: Secondary | ICD-10-CM | POA: Diagnosis not present

## 2019-04-14 DIAGNOSIS — F419 Anxiety disorder, unspecified: Secondary | ICD-10-CM

## 2019-04-14 LAB — POCT URINALYSIS DIPSTICK
Bilirubin, UA: NEGATIVE
Blood, UA: NEGATIVE
Glucose, UA: NEGATIVE
Ketones, UA: NEGATIVE
Leukocytes, UA: NEGATIVE
Nitrite, UA: NEGATIVE
Protein, UA: NEGATIVE
Spec Grav, UA: >=1.03 — AB (ref 1.010–1.025)
Urobilinogen, UA: 0.2 E.U./dL
pH, UA: 6 (ref 5.0–8.0)

## 2019-04-14 MED ORDER — ALPRAZOLAM 1 MG PO TABS: ORAL_TABLET | ORAL | 5 refills | Status: DC

## 2019-04-14 MED ORDER — DOXYCYCLINE HYCLATE 100 MG PO TABS: 100.0000 mg | ORAL_TABLET | Freq: Two times a day (BID) | ORAL | 0 refills | Status: DC

## 2019-04-14 MED ORDER — VILAZODONE HCL 20 MG PO TABS: ORAL_TABLET | ORAL | 1 refills | Status: DC

## 2019-04-14 NOTE — Patient Instructions (Signed)
Varicocele    A varicocele is a swelling of veins in the scrotum. The scrotum is the sac that contains the testicles. Varicoceles can occur on either side of the scrotum, but they are more common on the left side. They occur most often in teenage boys and young men.  In most cases, varicoceles are not a serious problem. They are usually small and painless and do not require treatment. Tests may be done to confirm the diagnosis. Treatment may be needed if:  · A varicocele is large, causes a lot of pain, or causes pain when exercising.  · Varicoceles are found on both sides of the scrotum.  · A varicocele causes a decrease in the size of the testicle in a growing adolescent.  · The person has fertility problems.  What are the causes?  This condition is the result of valves in the veins not working properly. Valves in the veins help to return blood from the scrotum and testicles to the heart. If these valves do not work well, blood flows backward and backs up into the veins, which causes the veins to swell. This is similar to what happens when varicose veins form in the leg.  What are the signs or symptoms?  Most varicoceles do not cause any symptoms. If symptoms do occur, they may include:  · Swelling on one side of the scrotum. The swelling may be more obvious when you are standing up.  · A lumpy feeling in the scrotum.  · A heavy feeling on one side of the scrotum.  · A dull ache in the scrotum, especially after exercise or prolonged standing or sitting.  · Slower growth or reduced size of the testicle on the side of the varicocele (in young males).  · Problems with fertility. This can occur if the testicle does not grow normally.  How is this diagnosed?  This condition may be diagnosed with a physical exam. You may also have an imaging test called an ultrasound to confirm the diagnosis and to help rule out other causes of the swelling.  How is this treated?  Treatment is usually not needed for this condition. If  you have any pain, your health care provider may prescribe or recommend medicine to help relieve it. You may need regular exams so your health care provider can monitor the varicocele to ensure that it does not cause problems. When further treatment is needed, it may involve one of these options:  · Varicocelectomy. This is a surgery in which the swollen veins are tied off so that the flow of blood goes to other veins instead.  · Embolization. In this procedure, a small tube (catheter) is used to place metal coils or other blocking items in the veins. This cuts off the blood flow to the swollen veins.  Follow these instructions at home:  · Take over-the-counter and prescription medicines only as told by your health care provider.  · Wear supportive underwear.  · Use an athletic supporter when participating in sports activities.  · Keep all follow-up visits as told by your health care provider. This is important.  Contact a health care provider if:  · Your pain is increasing.  · You have redness in the affected area.  · Your testicle becomes enlarged, swollen, or painful.  · You have swelling that does not decrease when you are lying down.  · One of your testicles is smaller than the other.  Summary  · Varicocele is a condition in   which the veins in the scrotum are swollen or enlarged.  · In most cases, varicoceles do not require treatment.  · Treatment may be needed if you have pain, have problems with infertility, or have a smaller testicle associated with the varicocele.  · In some cases, the condition may be treated with a procedure to cut off the flow of blood to the swollen veins.  This information is not intended to replace advice given to you by your health care provider. Make sure you discuss any questions you have with your health care provider.  Document Released: 02/01/2001 Document Revised: 08/11/2017 Document Reviewed: 08/11/2017  Elsevier Interactive Patient Education © 2019 [employer]

## 2019-04-14 NOTE — Addendum Note (Signed)
Addended by: Jomarie Longs on: 04/14/2019 12:23 PM   Modules accepted: Orders

## 2019-04-14 NOTE — Progress Notes (Signed)
No varicocele, hydrocele, good blood flow. Your epidiymis is more prominent but normal after vasectomy.   I will send over antibiotic to treat infection starting in epidiymis. Will make referral to urology.

## 2019-04-14 NOTE — Progress Notes (Addendum)
Subjective:    Patient ID: Joshua Kaiser, male    DOB: 08-02-1981, 38 y.o.   MRN: [id number]  HPI  Pt is a 38 yo male with MDD, GAD, PTSD, hypothyroidism who presents to the clinic for medication refills.   Overall his anxiety and depression is controlled. He uses xanax as needed for increased anxiety.   He has been working on weight loss with exercise and eating better. Lost 20lbs in 6 months.   He does mention some tenderness of left testicle. He had vasectomy a few years ago. Since then he has had some intermittent discomfort in testicle area. At times he can feel a firm lump. He went back to urologist at one point and diagnosed with infection and put on antibiotic. Symptoms resolved. About 2 weeks ago symptoms came back. Not worsening but seems to be having more and more tenderness. Denies any swelling or warmth to touch. No urinary symptoms. No penile discharge. Having intercourse with one woman his wife.   .. Active Ambulatory Problems    Diagnosis Date Noted  . Anxiety 11/04/2012  . Hypothyroidism 11/04/2012  . Right knee meniscal tear 11/04/2012  . 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 Special screening examination for neoplasm of prostate 02/19/2015  . Low energy 01/06/2017  . Injury of left shoulder 01/19/2017  . 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 PTSD (post-traumatic stress disorder) 07/11/2017  . Family history of schizophrenia 07/11/2017  . Elevated bilirubin 04/18/2018  . Testicular pain, left 04/14/2019  . Recurrent major depressive disorder, in partial remission (HCC) 04/14/2019   Resolved Ambulatory Problems    Diagnosis Date Noted  . No Resolved Ambulatory Problems   No Additional Past Medical History      Review of Systems  All other systems reviewed and are negative.      Objective:   Physical Exam Vitals signs reviewed.  Constitutional:      Appearance: Normal appearance.  HENT:     Head: Normocephalic.     Comments: Small  flesh colored mobile firm mass approximately 1-912mm. No erythema or warmth. Non tender.  Cardiovascular:     Rate and Rhythm: Normal rate and regular rhythm.  Pulmonary:     Effort: Pulmonary effort is normal.  Neurological:     General: No focal deficit present.     Mental Status: He is alert and oriented to person, place, and time.  Psychiatric:        Mood and Affect: Mood normal.           Assessment & Plan:  Marland Kitchen.Marland Kitchen.[name] was seen today for follow-up.  Diagnoses and all orders for this visit:  Acquired hypothyroidism -     TSH -     COMPLETE METABOLIC PANEL WITH GFR  Anxiety -     ALPRAZolam (XANAX) 1 MG tablet; Take one tablet as needed once a day. -     Vilazodone HCl 20 MG TABS; Take one tablet daily.  Screening for lipid disorders -     Lipid Panel w/reflex Direct LDL  Screening for diabetes mellitus -     COMPLETE METABOLIC PANEL WITH GFR  Testicular pain, left -     US SCROTUM -     POCT Urinalysis Dipstick  Recurrent major depressive disorder, in partial remission (HCC) -     Vilazodone HCl 20 MG TABS; Take one tablet daily.    .. GAD 7 : Generalized Anxiety Score 04/14/2019 10/14/2018 04/15/2018 10/15/2017  Nervous, Anxious, on Edge 1  3 2 2   Control/stop worrying 1 3 1 2   Worry too much - different things 1 3 1 2   Trouble relaxing 1 3 1 1   Restless 1 3 0 1  Easily annoyed or irritable 0 3 1 2   Afraid - awful might happen 0 3 - 0  Total GAD 7 Score 5 21 - 10  Anxiety Difficulty Somewhat difficult Very difficult Somewhat difficult -    .Marland Kitchen Depression screen [facility] 2/9 04/14/2019 10/14/2018 04/15/2018 10/15/2017 07/09/2017  Decreased Interest 0 1 0 0 1  Down, Depressed, Hopeless 0 2 0 0 2  PHQ - 2 Score 0 3 0 0 3  Altered sleeping 1 2 1 1 1   Tired, decreased energy 2 2 3 1 1   Change in appetite 2 2 0 1 2  Feeling bad or failure about yourself  0 2 0 0 2  Trouble concentrating 1 2 0 1 2  Moving slowly or fidgety/restless 0 0 0 0 0  Suicidal thoughts 0 0 0 0 1   PHQ-9 Score 6 13 4 4 12   Difficult doing work/chores Somewhat difficult Very difficult Not difficult at all - -   Pt has lost 20lbs in 6 months. He is trying. We do need to make sure TSH is in normal range and he is not hyperthyroid. Will adjust medications accordingly.   Medications refilled. Encouraged to continue to take xanax as needed only. Continue to work on CBT exercise to help control anxiety.   Pt does complain of left testicular pain. .. Results for orders placed or performed in visit on 04/14/19  POCT Urinalysis Dipstick  Result Value Ref Range   Color, UA Straw    Clarity, UA Clear    Glucose, UA Negative Negative   Bilirubin, UA Negative    Ketones, UA Negative    Spec Grav, UA >=1.030 (A) 1.010 - 1.025   Blood, UA Negative    pH, UA 6.0 5.0 - 8.0   Protein, UA Negative Negative   Urobilinogen, UA 0.2 0.2 or 1.0 E.U./dL   Nitrite, UA Negative    Leukocytes, UA Negative Negative   Appearance     Odor     UA is normal.  I do not suspect prostatitis due to no urinary symptoms.  Do not suspect testicular torsion due to severity and duration of symptoms.  Likely inflamed varicocele. Will get stat US today and make referral to urology as requested by patient.   [place] negative for any acute findings. epidiymis is more prominent and could represent a early infection. Sent doxycycline for 10 days.

## 2019-04-15 LAB — LIPID PANEL W/REFLEX DIRECT LDL
Cholesterol: 142 mg/dL (ref <200)
HDL: 49 mg/dL (ref >=40)
LDL Cholesterol (Calc): 76 mg/dL (calc)
Non-HDL Cholesterol (Calc): 93 mg/dL (calc) (ref <130)
Total CHOL/HDL Ratio: 2.9 (calc) (ref <5.0)
Triglycerides: 89 mg/dL (ref <150)

## 2019-04-15 LAB — COMPLETE METABOLIC PANEL WITH GFR
AG Ratio: 2 (calc) (ref 1.0–2.5)
ALT: 16 U/L (ref 9–46)
AST: 14 U/L (ref 10–40)
Albumin: 4.8 g/dL (ref 3.6–5.1)
Alkaline phosphatase (APISO): 37 U/L (ref 36–130)
BUN: 13 mg/dL (ref 7–25)
CO2: 25 mmol/L (ref 20–32)
Calcium: 9.7 mg/dL (ref 8.6–10.3)
Chloride: 105 mmol/L (ref 98–110)
Creat: 0.89 mg/dL (ref 0.60–1.35)
GFR, Est African American: 126 mL/min/{1.73_m2} (ref >=60)
GFR, Est Non African American: 108 mL/min/{1.73_m2} (ref >=60)
Globulin: 2.4 g/dL (calc) (ref 1.9–3.7)
Glucose, Bld: 84 mg/dL (ref 65–99)
Potassium: 4.6 mmol/L (ref 3.5–5.3)
Sodium: 139 mmol/L (ref 135–146)
Total Bilirubin: 2 mg/dL — ABNORMAL HIGH (ref 0.2–1.2)
Total Protein: 7.2 g/dL (ref 6.1–8.1)

## 2019-04-15 LAB — TSH: TSH: 0.87 mIU/L (ref 0.40–4.50)

## 2019-04-16 ENCOUNTER — Other Ambulatory Visit: Payer: Self-pay | Admitting: Physician Assistant

## 2019-04-16 ENCOUNTER — Encounter (INDEPENDENT_AMBULATORY_CARE_PROVIDER_SITE_OTHER): Payer: Managed Care, Other (non HMO) | Admitting: Physician Assistant

## 2019-04-16 DIAGNOSIS — E031 Congenital hypothyroidism without goiter: Secondary | ICD-10-CM

## 2019-04-16 DIAGNOSIS — N50812 Left testicular pain: Secondary | ICD-10-CM

## 2019-04-16 MED ORDER — LEVOTHYROXINE SODIUM 150 MCG PO TABS: ORAL_TABLET | ORAL | 3 refills | Status: DC

## 2019-04-16 NOTE — Progress Notes (Signed)
Call pt: thyroid looks great.  Cholesterol looks great.  Kidney, liver, glucose look great.  Bilirubin is elevated. Since your liver enzymes looks great it is mostly a genetic reason that your body does not break down bilirubin like it should. I suggest doing a work up since it did go up a bit from last recheck. Are you ok with getting more labs to further look at this?

## 2019-04-17 ENCOUNTER — Telehealth: Payer: Self-pay | Admitting: Neurology

## 2019-04-17 DIAGNOSIS — R17 Unspecified jaundice: Secondary | ICD-10-CM

## 2019-04-17 NOTE — Progress Notes (Signed)
Ok to order indirect and direct bilirubin with haptoglobin, and lactate dehydrogenase

## 2019-04-17 NOTE — Telephone Encounter (Signed)
Additional labs ordered per [facility]. Spoke with [facility] and they can not add labs onto sample already collected. Mychart message sent to patient.

## 2019-04-18 MED ORDER — SULFAMETHOXAZOLE-TRIMETHOPRIM 800-160 MG PO TABS: 1.0000 | ORAL_TABLET | Freq: Two times a day (BID) | ORAL | 0 refills | Status: AC

## 2019-04-18 NOTE — Telephone Encounter (Signed)
5 minutes spent

## 2019-04-18 NOTE — Addendum Note (Signed)
Addended by: Maryla Morrow on: 04/18/2019 01:57 PM   Modules accepted: Orders

## 2019-06-02 ENCOUNTER — Telehealth: Payer: Self-pay

## 2019-06-02 NOTE — Telephone Encounter (Signed)
His wife sent a MyChart message in her chart to ask why he, her husband, was charged a copay for a [employer].

## 2019-06-02 NOTE — Telephone Encounter (Signed)
I left pt a message letting him know that Lacretia Nicks has removed the My Chart charge

## 2019-07-07 ENCOUNTER — Other Ambulatory Visit: Payer: Self-pay | Admitting: Physician Assistant

## 2019-07-07 DIAGNOSIS — E031 Congenital hypothyroidism without goiter: Secondary | ICD-10-CM

## 2019-07-19 ENCOUNTER — Ambulatory Visit: Payer: Managed Care, Other (non HMO) | Admitting: Physician Assistant

## 2019-07-19 ENCOUNTER — Other Ambulatory Visit: Payer: Self-pay

## 2019-07-19 ENCOUNTER — Emergency Department (INDEPENDENT_AMBULATORY_CARE_PROVIDER_SITE_OTHER)
Admission: EM | Admit: 2019-07-19 | Discharge: 2019-07-19 | Disposition: A | Discharge status: Home / Self Care | Payer: 59 | Source: Home / Self Care | Attending: Emergency Medicine | Admitting: Emergency Medicine

## 2019-07-19 DIAGNOSIS — L255 Unspecified contact dermatitis due to plants, except food: Secondary | ICD-10-CM | POA: Diagnosis not present

## 2019-07-19 MED ORDER — METHYLPREDNISOLONE ACETATE 80 MG/ML IJ SUSP
80.0000 mg | Freq: Once | INTRAMUSCULAR | Status: AC
  Administered 2019-07-19: 80 mg via INTRAMUSCULAR

## 2019-07-19 MED ORDER — BETAMETHASONE DIPROPIONATE 0.05 % EX CREA: TOPICAL_CREAM | Freq: Two times a day (BID) | CUTANEOUS | 0 refills | Status: DC

## 2019-07-19 MED ORDER — PREDNISONE 10 MG (21) PO TBPK: ORAL_TABLET | ORAL | 0 refills | Status: DC

## 2019-07-19 NOTE — ED Triage Notes (Signed)
Cleaning land this weekend, and now has a rash on face/ eye area, and other places on body.

## 2019-07-19 NOTE — ED Provider Notes (Signed)
[facility]    CSN: [id number] Arrival date & time: 07/19/19  3545      History   Chief Complaint Chief Complaint  Patient presents with  . Rash    HPI Joshua Kaiser is a 38 y.o. male.    Rash  Cleaning land this weekend, and now has a rash on face/ eye area, and other places on body. Progressive pruritic rash on extremities, upper and lower as well as neck, now affecting face and eyelids, worsening this morning.  He feels it is from poison ivy or poison oak after clearing land 2 to 3 days ago. Has tried oral Benadryl, helped a little.  Tried OTC topical measures, did not help. In the past, he has had severe cases of poison ivy lasting 2 weeks, and he requests a cortisone shot today. Denies fever or chills or nausea or vomiting.  No history of tick bite. No dysphasia or cough or lip swelling or problems breathing. Past Medical History:  Diagnosis Date  . Anxiety 11/04/2012  . Hypothyroidism 11/04/2012  . Right knee meniscal tear 11/04/2012  . Right knee meniscal tear 11/04/2012    Patient Active Problem List   Diagnosis Date Noted  . Testicular pain, left 04/14/2019  . Recurrent major depressive disorder, in partial remission (HCC) 04/14/2019  . H/O vasectomy 04/14/2019  . Elevated bilirubin 04/18/2018  . 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 PTSD (post-traumatic stress disorder) 07/11/2017  . Family history of schizophrenia 07/11/2017  . Injury of left shoulder 01/19/2017  . Low energy 01/06/2017  . Special screening examination for neoplasm of prostate 02/19/2015  . 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 Anxiety 11/04/2012  . Hypothyroidism 11/04/2012  . Right knee meniscal tear 11/04/2012    Past Surgical History:  Procedure Laterality Date  . VASECTOMY  08/26/2012       Home Medications    Prior to Admission medications   Medication Sig Start Date End Date Taking? Authorizing Provider  ALPRAZolam Prudy Feeler) 1 MG tablet  Take one tablet as needed once a day. 04/14/19   Breeback, Jade L, PA-C  betamethasone dipropionate (DIPROLENE) 0.05 % cream Apply topically 2 (two) times daily. To affected areas.  (Do not use on face or genitals.) 07/19/19   Lajean Manes, MD  diclofenac (VOLTAREN) 75 MG EC tablet Take 1 tablet (75 mg total) by mouth 2 (two) times daily as needed for mild pain (take with meals). 04/27/16   Sunnie Nielsen, DO  levothyroxine (SYNTHROID) 150 MCG tablet TAKE 1 TABLET BY MOUTH ONCE DAILY WITH  AN  ADDITIONAL  HALF  TABLET  ON  SATURDAY  OR  SUNDAY 07/07/19   Breeback, Jade L, PA-C  Olopatadine HCl 0.2 % SOLN Apply 1 drop to eye daily. 02/16/19   Agapito Games, MD  predniSONE (STERAPRED UNI-PAK 21 TAB) 10 MG (21) TBPK tablet Take tapering dosage over 6 days as directed 07/19/19   Lajean Manes, MD  trimethoprim-polymyxin b (POLYTRIM) ophthalmic solution Place 2 drops into both eyes every 6 (six) hours. X 7 days 02/16/19   Agapito Games, MD  Vilazodone HCl 20 MG TABS Take one tablet daily. 04/14/19   Jomarie Longs, PA-C    Family History Family History  Problem Relation Age of Onset  . Hypertension Father   . Depression Mother     Social History Social History   Tobacco Use  . Smoking status: Former Smoker    Types: Cigarettes  . Smokeless tobacco: Never Used  Substance Use Topics  . Alcohol use: No    Alcohol/week: 0.0 standard drinks  . Drug use: No     Allergies   Effexor xr [venlafaxine hcl er], Naproxen, and Tramadol   Review of Systems Review of Systems  Skin: Positive for rash.  All other systems reviewed and are negative.   Pertinent items noted in HPI and remainder of comprehensive ROS otherwise negative.   Physical Exam   Vitals signs reviewed.  Constitutional:      General: He is not in acute distress.    Appearance: He is well-developed.  HENT:     Head: Normocephalic and atraumatic.  No lip swelling.  Oral airway intact. Eyes:     General: No  scleral icterus.    Pupils: Pupils are equal, round, and reactive to light.  Neck:     Musculoskeletal: Normal range of motion and neck supple.  Cardiovascular:     Rate and Rhythm: Normal rate and regular rhythm.  Pulmonary:     Effort: Pulmonary effort is normal.  Abdominal:     General: There is no distension.  Skin:    General: Skin is warm and dry. Erythematous maculopapular eruption diffusely.  Affecting face, both eyelids especially left eyelid. Also affecting neck, trunk, upper and lower extremities. Neurological:     Mental Status: He is alert and oriented to person, place, and time.     Cranial Nerves: No cranial nerve deficit.  Psychiatric:        Behavior: Behavior normal.       Triage Vital Signs ED Triage Vitals  Enc Vitals Group     BP 07/19/19 0844 122/78     Pulse Rate 07/19/19 0844 69     Resp 07/19/19 0844 20     Temp 07/19/19 0844 (!) 97.5 F (36.4 C)     Temp Source 07/19/19 0844 Oral     SpO2 07/19/19 0844 98 %     Weight 07/19/19 0845 200 lb (90.7 kg)     Height 07/19/19 0845 6' (1.829 m)     Head Circumference --      Peak Flow --      Pain Score 07/19/19 0845 0     Pain Loc --      Pain Edu? --      Excl. in [place]? --    No data found.  Updated Vital Signs BP 122/78 (BP Location: Right Arm)   Pulse 69   Temp (!) 97.5 F (36.4 C) (Oral)   Resp 20   Ht 6' (1.829 m)   Wt 90.7 kg   SpO2 98%   BMI 27.12 kg/m   Visual Acuity Right Eye Distance:   Left Eye Distance:   Bilateral Distance:    Right Eye Near:   Left Eye Near:    Bilateral Near:       UC Treatments / Results  Labs (all labs ordered are listed, but only abnormal results are displayed) Labs Reviewed - No data to display  EKG   Radiology No results found.  Procedures Procedures (including critical care time)  Medications Ordered in UC Medications  methylPREDNISolone acetate (DEPO-MEDROL) injection 80 mg (80 mg Intramuscular Given 07/19/19 0912)    Initial  Impression / Assessment and Plan / UC Course  I have reviewed the triage vital signs and the nursing notes.  Pertinent labs & imaging results that were available during my care of the patient were reviewed by me and considered in my medical decision  making (see chart for details).      Final Clinical Impressions(s) / UC Diagnoses   Final diagnoses:  Contact dermatitis due to plant     Discharge Instructions     You have poison ivy or poison oak.  This is a contact dermatitis. Today, we gave you a "cortisone shot" of Depo-Medrol. Also, sent prescriptions to your pharmacy.  Prednisone Dosepak, take as directed on label. Also, strong cream, apply to affected areas twice a day, but do not use this cream on face or genitals. May take OTC Zyrtec or Benadryl as needed for itch. See also general instructions in the attached information sheet.    ED Prescriptions    Medication Sig Dispense Auth. Provider   predniSONE (STERAPRED UNI-PAK 21 TAB) 10 MG (21) TBPK tablet Take tapering dosage over 6 days as directed 21 tablet Lajean ManesMassey, David, MD   betamethasone dipropionate (DIPROLENE) 0.05 % cream Apply topically 2 (two) times daily. To affected areas.  (Do not use on face or genitals.) 30 g Lajean ManesMassey, David, MD     Follow-up with your primary care doctor in 5-7 days if not improving, or sooner if symptoms become worse. Precautions discussed. Red flags discussed. Questions invited and answered. Patient voiced understanding and agreement.    Lajean ManesMassey, David, MD 07/19/19 [phone]

## 2019-07-19 NOTE — Discharge Instructions (Addendum)
You have poison ivy or poison oak.  This is a contact dermatitis. Today, we gave you a "cortisone shot" of Depo-Medrol. Also, sent prescriptions to your pharmacy.  Prednisone Dosepak, take as directed on label. Also, strong cream, apply to affected areas twice a day, but do not use this cream on face or genitals. May take OTC Zyrtec or Benadryl as needed for itch. See also general instructions in the attached information sheet.

## 2019-07-20 LAB — BILIRUBIN, FRACTIONATED(TOT/DIR/INDIR)
Bilirubin, Direct: 0.2 mg/dL (ref 0.0–0.2)
Indirect Bilirubin: 1 mg/dL (calc) (ref 0.2–1.2)
Total Bilirubin: 1.2 mg/dL (ref 0.2–1.2)

## 2019-07-20 LAB — HAPTOGLOBIN: Haptoglobin: 134 mg/dL (ref 43–212)

## 2019-07-20 LAB — LACTATE DEHYDROGENASE: LDH: 143 U/L (ref 100–220)

## 2019-07-20 NOTE — Telephone Encounter (Signed)
Amante,   Bilirubin normalized. Labs look great. No concerns.

## 2019-08-01 ENCOUNTER — Encounter: Payer: Self-pay | Admitting: Physician Assistant

## 2019-08-01 DIAGNOSIS — Z818 Family history of other mental and behavioral disorders: Secondary | ICD-10-CM | POA: Insufficient documentation

## 2019-10-16 ENCOUNTER — Telehealth: Payer: Self-pay | Admitting: Neurology

## 2019-10-16 DIAGNOSIS — E039 Hypothyroidism, unspecified: Secondary | ICD-10-CM

## 2019-10-16 NOTE — Telephone Encounter (Signed)
Thanks

## 2019-10-16 NOTE — Telephone Encounter (Signed)
Pharmacy sent request to change manufacturer of Levothyroxine. Okay sent to pharmacy. Patient made aware and will have TSH drawn in 6 weeks. Joshua Kaiser - fyi.

## 2019-10-20 ENCOUNTER — Ambulatory Visit: Payer: Managed Care, Other (non HMO) | Admitting: Physician Assistant

## 2019-10-27 ENCOUNTER — Encounter: Payer: Self-pay | Admitting: Physician Assistant

## 2019-10-27 ENCOUNTER — Ambulatory Visit (INDEPENDENT_AMBULATORY_CARE_PROVIDER_SITE_OTHER): Payer: 59 | Admitting: Physician Assistant

## 2019-10-27 VITALS — Temp 98.6°F | Ht 72.0 in | Wt 200.0 lb

## 2019-10-27 DIAGNOSIS — Z20828 Contact with and (suspected) exposure to other viral communicable diseases: Secondary | ICD-10-CM

## 2019-10-27 DIAGNOSIS — F419 Anxiety disorder, unspecified: Secondary | ICD-10-CM | POA: Diagnosis not present

## 2019-10-27 DIAGNOSIS — Z79899 Other long term (current) drug therapy: Secondary | ICD-10-CM | POA: Diagnosis not present

## 2019-10-27 DIAGNOSIS — F3341 Major depressive disorder, recurrent, in partial remission: Secondary | ICD-10-CM

## 2019-10-27 DIAGNOSIS — Z20822 Contact with and (suspected) exposure to covid-19: Secondary | ICD-10-CM

## 2019-10-27 DIAGNOSIS — E039 Hypothyroidism, unspecified: Secondary | ICD-10-CM

## 2019-10-27 MED ORDER — ALPRAZOLAM 1 MG PO TABS: ORAL_TABLET | ORAL | 1 refills | Status: DC

## 2019-10-27 MED ORDER — VIIBRYD 40 MG PO TABS: 40.0000 mg | ORAL_TABLET | Freq: Every day | ORAL | 1 refills | Status: DC

## 2019-10-27 NOTE — Progress Notes (Signed)
Needs refills. No issues. PHQ9-GAD7 completed.

## 2019-10-27 NOTE — Progress Notes (Signed)
Patient ID: Joshua Kaiser, male   DOB: [birth year], 38 y.o.   MRN: [id number] .Marland KitchenVirtual Visit via Video Note  I connected with Joshua Kaiser on 10/30/19 at  8:30 AM EST by a video enabled telemedicine application and verified that I am speaking with the correct person using two identifiers.  Location: Patient: home Provider: home   I discussed the limitations of evaluation and management by telemedicine and the availability of in person appointments. The patient expressed understanding and agreed to proceed.  History of Present Illness: Pt is a 38 male with hypothyroidism, anxiety, MDD who presents to the clinic for 6 month follow up.   Pt is struggling some with his anxiety.  He feels like when he takes his Xanax it is not as effective as it once was.  He is finding the need to take 1-1/2 tablets to get the same effect.  He still continues to only take it once a day.  Patient had good results with fibrin and he continues to take daily.  Patient denies any suicidal or homicidal thoughts.  Patient goes to a counselor regularly.  Patient is taking his thyroid medication regularly.  He does need a refill.  Patient feels like he had Covid-like symptoms in December/january last year/this year. He would like to test for antibodies.   .. Active Ambulatory Problems    Diagnosis Date Noted  . Anxiety 11/04/2012  . Hypothyroidism 11/04/2012  . Right knee meniscal tear 11/04/2012  . 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 Special screening examination for neoplasm of prostate 02/19/2015  . Low energy 01/06/2017  . Injury of left shoulder 01/19/2017  . Moderate major depression ([place]) 07/11/2017  . PTSD (post-traumatic stress disorder) 07/11/2017  . Elevated bilirubin 04/18/2018  . Testicular pain, left 04/14/2019  . Recurrent major depressive disorder, in partial remission ([place]) 04/14/2019  . H/O vasectomy 04/14/2019  . Family history of bipolar disorder 08/01/2019   Resolved  Ambulatory Problems    Diagnosis Date Noted  . No Resolved Ambulatory Problems   No Additional Past Medical History   Reviewed med, allergies, problem list.     Observations/Objective: No acute distress. Normal mood and appearance.   .. Today's Vitals   10/27/19 0831  Temp: 98.6 F (37 C)  TempSrc: Oral  Weight: 200 lb (90.7 kg)  Height: 6' (1.829 m)   Body mass index is 27.12 kg/m.  .. Depression screen [facility] 2/9 10/27/2019 04/14/2019 10/14/2018 04/15/2018 10/15/2017  Decreased Interest 0 0 1 0 0  Down, Depressed, Hopeless 0 0 2 0 0  PHQ - 2 Score 0 0 3 0 0  Altered sleeping - 1 2 1 1   Tired, decreased energy - 2 2 3 1   Change in appetite - 2 2 0 1  Feeling bad or failure about yourself  - 0 2 0 0  Trouble concentrating - 1 2 0 1  Moving slowly or fidgety/restless - 0 0 0 0  Suicidal thoughts - 0 0 0 0  PHQ-9 Score - 6 13 4 4   Difficult doing work/chores - Somewhat difficult Very difficult Not difficult at all -   .. GAD 7 : Generalized Anxiety Score 10/27/2019 04/14/2019 10/14/2018 04/15/2018  Nervous, Anxious, on Edge 3 1 3 2   Control/stop worrying 3 1 3 1   Worry too much - different things 3 1 3 1   Trouble relaxing 0 1 3 1   Restless 0 1 3 0  Easily annoyed or irritable 0 0 3 1  Afraid -  awful might happen 0 0 3 -  Total GAD 7 Score 9 5 21  -  Anxiety Difficulty Not difficult at all Somewhat difficult Very difficult Somewhat difficult    Assessment and Plan: Marland KitchenTeshaun was seen today for follow-up.  Diagnoses and all orders for this visit:  Anxiety -     ALPRAZolam (XANAX) 1 MG tablet; Take one tablet as needed once a day. -     Vilazodone HCl (VIIBRYD) 40 MG TABS; Take 1 tablet (40 mg total) by mouth daily.  Recurrent major depressive disorder, in partial remission (HCC) -     Vilazodone HCl (VIIBRYD) 40 MG TABS; Take 1 tablet (40 mg total) by mouth daily.  Close exposure to COVID-19 virus -     SAR CoV2 Serology (COVID 19)AB(IGG)IA  Acquired hypothyroidism -      TSH  Medication management -     TSH   PHQ-9 looks great and GAD-7 is little elevated.  Patient mentions increasing his Xanax from once a day to twice a day.  Before we do that I would like to increase Viibryd and give that 4 to 6 weeks.  Discussed with patient risk of tolerance and dependency with Xanax.  Continue to work with counselor and other cognitive behavioral techniques to help with anxiety. Follow up in 4 weeks.   Labs for thyroid to be rechecked.  Will adjust medicines as needed.  Patient suspects he had Covid due to his symptoms in dec/jan last/this year. He would like to test for antibodies.    Follow Up Instructions:    I discussed the assessment and treatment plan with the patient. The patient was provided an opportunity to ask questions and all were answered. The patient agreed with the plan and demonstrated an understanding of the instructions.   The patient was advised to call back or seek an in-person evaluation if the symptoms worsen or if the condition fails to improve as anticipated.   Casimiro Needle, PA-C

## 2019-12-19 ENCOUNTER — Encounter: Payer: Self-pay | Admitting: Physician Assistant

## 2019-12-22 ENCOUNTER — Encounter: Payer: Self-pay | Admitting: Physician Assistant

## 2020-01-10 ENCOUNTER — Encounter: Payer: Self-pay | Admitting: Physician Assistant

## 2020-01-10 DIAGNOSIS — F419 Anxiety disorder, unspecified: Secondary | ICD-10-CM

## 2020-01-10 MED ORDER — ALPRAZOLAM 1 MG PO TABS: ORAL_TABLET | ORAL | 0 refills | Status: DC

## 2020-01-13 ENCOUNTER — Other Ambulatory Visit: Payer: Self-pay | Admitting: Physician Assistant

## 2020-01-13 DIAGNOSIS — E031 Congenital hypothyroidism without goiter: Secondary | ICD-10-CM

## 2020-01-19 ENCOUNTER — Ambulatory Visit (INDEPENDENT_AMBULATORY_CARE_PROVIDER_SITE_OTHER): Payer: 59 | Admitting: Physician Assistant

## 2020-01-19 ENCOUNTER — Other Ambulatory Visit: Payer: Self-pay

## 2020-01-19 ENCOUNTER — Encounter: Payer: Self-pay | Admitting: Physician Assistant

## 2020-01-19 VITALS — BP 120/67 | HR 73 | Ht 72.0 in | Wt 202.0 lb

## 2020-01-19 DIAGNOSIS — F419 Anxiety disorder, unspecified: Secondary | ICD-10-CM

## 2020-01-19 DIAGNOSIS — F428 Other obsessive-compulsive disorder: Secondary | ICD-10-CM

## 2020-01-19 DIAGNOSIS — R5382 Chronic fatigue, unspecified: Secondary | ICD-10-CM

## 2020-01-19 DIAGNOSIS — Z8659 Personal history of other mental and behavioral disorders: Secondary | ICD-10-CM

## 2020-01-19 DIAGNOSIS — F6389 Other impulse disorders: Secondary | ICD-10-CM

## 2020-01-19 DIAGNOSIS — E039 Hypothyroidism, unspecified: Secondary | ICD-10-CM

## 2020-01-19 DIAGNOSIS — F4310 Post-traumatic stress disorder, unspecified: Secondary | ICD-10-CM

## 2020-01-19 DIAGNOSIS — R41840 Attention and concentration deficit: Secondary | ICD-10-CM | POA: Diagnosis not present

## 2020-01-19 DIAGNOSIS — R109 Unspecified abdominal pain: Secondary | ICD-10-CM

## 2020-01-19 DIAGNOSIS — R1032 Left lower quadrant pain: Secondary | ICD-10-CM

## 2020-01-19 LAB — POCT URINALYSIS DIP (CLINITEK)
Bilirubin, UA: NEGATIVE
Blood, UA: NEGATIVE
Glucose, UA: NEGATIVE mg/dL
Ketones, POC UA: NEGATIVE mg/dL
Leukocytes, UA: NEGATIVE
Nitrite, UA: NEGATIVE
POC PROTEIN,UA: NEGATIVE
Spec Grav, UA: 1.02 (ref 1.010–1.025)
Urobilinogen, UA: 0.2 E.U./dL
pH, UA: 7 (ref 5.0–8.0)

## 2020-01-19 MED ORDER — AMPHETAMINE-DEXTROAMPHET ER 10 MG PO CP24: 10.0000 mg | ORAL_CAPSULE | Freq: Every day | ORAL | 0 refills | Status: DC

## 2020-01-19 NOTE — Progress Notes (Signed)
Subjective:    Patient ID: Joshua Kaiser, male    DOB: 08/23/1981, 39 y.o.   MRN: [id number]  HPI  Patient is a 39 year old male with PTSD, anxiety, hypothyroidism, history of ADHD who presents to the clinic for medication follow-up.  Patient had to stop Viibryd because it made him have too many outburst of anger.  He has now only on Xanax.  He does not feel like this is optimal therapy.  He feels like when he takes the Xanax he just goes to sleep.  He has tried numerous SSRIs and SSRIs and they just do the same thing and does not want to continue to try them.  At times they work for a little while then they seem to make everything worse or he has a side effect.  Having his wife is really monitored his symptoms and feel like they will down to fatigue, overthinking, impulsivity, obsessive thoughts.  He does have a history of ADHD in his childhood.  That has not been treated in years.  He wonders if he would benefit from this therapy.  He is currently still in counseling.   He continues to have intermittent left groin pain that now radiates into his left lower quadrant and flank area as well as into his left testicle.  He had an ultrasound of his scrotum done in June 2020 for same symptoms.  Ultrasound did show a epidermal cyst and enlarged epideyma.  Symptoms are not all the time but they can get worse.  [place]ms feels like it is a hernia. No changes with lifting.   He denies any bulge.  He denies any pain with urination or changes in frequency/urgency.  He would like further evaluation.   .. Active Ambulatory Problems    Diagnosis Date Noted  . Anxiety 11/04/2012  . Hypothyroidism 11/04/2012  . Right knee meniscal tear 11/04/2012  . 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 Special screening examination for neoplasm of prostate 02/19/2015  . Low energy 01/06/2017  . Injury of left shoulder 01/19/2017  . Moderate major depression ([place]) 07/11/2017  . PTSD (post-traumatic stress disorder)  07/11/2017  . Elevated bilirubin 04/18/2018  . Testicular pain, left 04/14/2019  . Recurrent major depressive disorder, in partial remission ([place]) 04/14/2019  . H/O vasectomy 04/14/2019  . Family history of bipolar disorder 08/01/2019  . Unilateral groin pain, left 01/22/2020  . Flank pain 01/22/2020  . Inattention 01/22/2020  . Chronic fatigue 01/22/2020  . History of ADHD 01/22/2020  . 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 Obsessional thoughts 01/22/2020   Resolved Ambulatory Problems    Diagnosis Date Noted  . No Resolved Ambulatory Problems   No Additional Past Medical History              Review of Systems See HPI.     Objective:   Physical Exam Vitals reviewed.  Constitutional:      Appearance: Normal appearance.  HENT:     Head: Normocephalic.  Cardiovascular:     Rate and Rhythm: Normal rate and regular rhythm.     Pulses: Normal pulses.  Pulmonary:     Effort: Pulmonary effort is normal.     Breath sounds: Normal breath sounds.  Abdominal:     General: Bowel sounds are normal. There is no distension.     Palpations: Abdomen is soft. There is no mass.     Tenderness: There is no abdominal tenderness. There is no right CVA tenderness, left CVA  tenderness, guarding or rebound.     Hernia: No hernia is present.  Neurological:     General: No focal deficit present.     Mental Status: He is alert and oriented to person, place, and time.  Psychiatric:        Mood and Affect: Mood normal.       .. Depression screen [facility] 2/9 01/19/2020 10/27/2019 04/14/2019 10/14/2018 04/15/2018  Decreased Interest 0 0 0 1 0  Down, Depressed, Hopeless 0 0 0 2 0  PHQ - 2 Score 0 0 0 3 0  Altered sleeping 2 - 1 2 1   Tired, decreased energy 1 - 2 2 3   Change in appetite 0 - 2 2 0  Feeling bad or failure about yourself  0 - 0 2 0  Trouble concentrating 2 - 1 2 0  Moving slowly or fidgety/restless 0  - 0 0 0  Suicidal thoughts 0 - 0 0 0  PHQ-9 Score 5 - 6 13 4   Difficult doing work/chores Somewhat difficult - Somewhat difficult Very difficult Not difficult at all   . GAD 7 : Generalized Anxiety Score 01/19/2020 10/27/2019 04/14/2019 10/14/2018  Nervous, Anxious, on Edge 2 3 1 3   Control/stop worrying 1 3 1 3   Worry too much - different things 1 3 1 3   Trouble relaxing 2 0 1 3  Restless 1 0 1 3  Easily annoyed or irritable 1 0 0 3  Afraid - awful might happen 0 0 0 3  Total GAD 7 Score 8 9 5 21   Anxiety Difficulty Somewhat difficult Not difficult at all Somewhat difficult Very difficult    .10/29/2019   Adult ADHD Self Report Scale (most recent)    Adult ADHD Self-Report Scale (ASRS-v1.1) Symptom Checklist - 01/19/20 0800      Part A   1. How often do you have trouble wrapping up the final details of a project, once the challenging parts have been done?  Rarely  2. How often do you have difficulty getting things done in order when you have to do a task that requires organization?  (!) Sometimes    3. How often do you have problems remembering appointments or obligations?  Rarely  4. When you have a task that requires a lot of thought, how often do you avoid or delay getting started?  (!) Very Often    5. How often do you fidget or squirm with your hands or feet when you have to sit down for a long time?  (!) Very Often  6. How often do you feel overly active and compelled to do things, like you were driven by a motor?  (!) Often      Part B   7. How often do you make careless mistakes when you have to work on a boring or difficult project?  Rarely  8. How often do you have difficulty keeping your attention when you are doing boring or repetitive work?  (!) Often    9. How often do you have difficulty concentrating on what people say to you, even when they are speaking to you directly?  (!) Sometimes  10. How often do you misplace or have difficulty finding things at home or at work?  Sometimes     11. How often are you distracted by activity or noise around you?  (!) Often  12. How often do you leave your seat in meetings or other situations in which you are expected to remain  seated?  Never    13. How often do you feel restless or fidgety?  (!) Often  14. How often do you have difficulty unwinding and relaxing when you have time to yourself?  (!) Often    15. How often do you find yourself talking too much when you are in social situations?  (!) Often  16. When you are in a conversation, how often do you find yourself finishing the sentences of the people you are talking to, before they can finish them themselves?  (!) Often    17. How often do you have difficulty waiting your turn in situations when turn taking is required?  Never  18. How often do you interrupt others when they are busy?  Rarely      MDQ-negative 3 out of 15.      Assessment & Plan:  Marland KitchenMarland KitchenDanyael was seen today for follow-up.  Diagnoses and all orders for this visit:  Anxiety  Chronic fatigue -     TSH -     CBC -     B12 and Folate Panel -     VITAMIN D 25 Hydroxy (Vit-D Deficiency, Fractures) -     COMPLETE METABOLIC PANEL WITH GFR -     Fe+TIBC+Fer  Acquired hypothyroidism -     TSH  Inattention -     CBC -     B12 and Folate Panel -     VITAMIN D 25 Hydroxy (Vit-D Deficiency, Fractures) -     COMPLETE METABOLIC PANEL WITH GFR -     Fe+TIBC+Fer -     amphetamine-dextroamphetamine (ADDERALL XR) 10 MG 24 hr capsule; Take 1 capsule (10 mg total) by mouth daily.  Flank pain -     POCT URINALYSIS DIP (CLINITEK)  Unilateral groin pain, left  History of ADHD -     amphetamine-dextroamphetamine (ADDERALL XR) 10 MG 24 hr capsule; Take 1 capsule (10 mg total) by mouth daily.  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  Obsessional  thoughts -     amphetamine-dextroamphetamine (ADDERALL XR) 10 MG 24 hr capsule; Take 1 capsule (10 mg total) by mouth daily.  PTSD (post-traumatic stress disorder)  Left lower quadrant abdominal pain -     CT Abdomen Pelvis Wo Contrast   UA negative for blood, leuks, nitrates.  Will get CT scan of abdomen and pelvis.   Anxiety is high and positive ADHD screen. MDQ negative for mood disorder.  Will do trial of adderall low dose.  Continue therapy.  Continue xanax as needed. Use sparingly.   Follow up in 1 month.   Spent 35 minutes with patient.

## 2020-01-19 NOTE — Patient Instructions (Signed)
Will order CT.  Try stimulant.  Let me know in  1 month.

## 2020-01-19 NOTE — Progress Notes (Signed)
UA normal.

## 2020-01-20 LAB — COMPLETE METABOLIC PANEL WITH GFR
AG Ratio: 2 (calc) (ref 1.0–2.5)
ALT: 17 U/L (ref 9–46)
AST: 15 U/L (ref 10–40)
Albumin: 4.6 g/dL (ref 3.6–5.1)
Alkaline phosphatase (APISO): 33 U/L — ABNORMAL LOW (ref 36–130)
BUN: 9 mg/dL (ref 7–25)
CO2: 28 mmol/L (ref 20–32)
Calcium: 9.7 mg/dL (ref 8.6–10.3)
Chloride: 105 mmol/L (ref 98–110)
Creat: 0.9 mg/dL (ref 0.60–1.35)
GFR, Est African American: 125 mL/min/{1.73_m2} (ref >=60)
GFR, Est Non African American: 108 mL/min/{1.73_m2} (ref >=60)
Globulin: 2.3 g/dL (calc) (ref 1.9–3.7)
Glucose, Bld: 83 mg/dL (ref 65–99)
Potassium: 4.8 mmol/L (ref 3.5–5.3)
Sodium: 140 mmol/L (ref 135–146)
Total Bilirubin: 1.1 mg/dL (ref 0.2–1.2)
Total Protein: 6.9 g/dL (ref 6.1–8.1)

## 2020-01-20 LAB — CBC
HCT: 45.7 % (ref 38.5–50.0)
Hemoglobin: 15.3 g/dL (ref 13.2–17.1)
MCH: 29.6 pg (ref 27.0–33.0)
MCHC: 33.5 g/dL (ref 32.0–36.0)
MCV: 88.4 fL (ref 80.0–100.0)
MPV: 10 fL (ref 7.5–12.5)
Platelets: 295 10*3/uL (ref 140–400)
RBC: 5.17 10*6/uL (ref 4.20–5.80)
RDW: 13 % (ref 11.0–15.0)
WBC: 5.4 10*3/uL (ref 3.8–10.8)

## 2020-01-20 LAB — IRON,TIBC AND FERRITIN PANEL
%SAT: 29 % (calc) (ref 20–48)
Ferritin: 144 ng/mL (ref 38–380)
Iron: 99 ug/dL (ref 50–180)
TIBC: 341 mcg/dL (calc) (ref 250–425)

## 2020-01-20 LAB — B12 AND FOLATE PANEL
Folate: 17.5 ng/mL
Vitamin B-12: 376 pg/mL (ref 200–1100)

## 2020-01-20 LAB — TSH: TSH: 2.6 mIU/L (ref 0.40–4.50)

## 2020-01-22 ENCOUNTER — Encounter: Payer: Self-pay | Admitting: Physician Assistant

## 2020-01-22 ENCOUNTER — Telehealth (INDEPENDENT_AMBULATORY_CARE_PROVIDER_SITE_OTHER): Payer: 59 | Admitting: Physician Assistant

## 2020-01-22 VITALS — Temp 98.0°F | Ht 72.0 in | Wt 202.0 lb

## 2020-01-22 DIAGNOSIS — L237 Allergic contact dermatitis due to plants, except food: Secondary | ICD-10-CM | POA: Diagnosis not present

## 2020-01-22 DIAGNOSIS — F6389 Other impulse disorders: Secondary | ICD-10-CM | POA: Insufficient documentation

## 2020-01-22 DIAGNOSIS — R109 Unspecified abdominal pain: Secondary | ICD-10-CM | POA: Insufficient documentation

## 2020-01-22 DIAGNOSIS — Z8659 Personal history of other mental and behavioral disorders: Secondary | ICD-10-CM | POA: Insufficient documentation

## 2020-01-22 DIAGNOSIS — R41840 Attention and concentration deficit: Secondary | ICD-10-CM | POA: Insufficient documentation

## 2020-01-22 DIAGNOSIS — R1032 Left lower quadrant pain: Secondary | ICD-10-CM | POA: Insufficient documentation

## 2020-01-22 DIAGNOSIS — F428 Other obsessive-compulsive disorder: Secondary | ICD-10-CM | POA: Insufficient documentation

## 2020-01-22 DIAGNOSIS — R5382 Chronic fatigue, unspecified: Secondary | ICD-10-CM | POA: Insufficient documentation

## 2020-01-22 MED ORDER — PREDNISONE 10 MG (21) PO TBPK: ORAL_TABLET | ORAL | 0 refills | Status: DC

## 2020-01-22 NOTE — Progress Notes (Signed)
Staley,   Thyroid is perfect.  Iron looks great.  Kidney and liver look good.  Glucose looks good.  B12 on low side of normal. I would start b12 a day.  Vitamin D pending.   Lesly Rubenstein

## 2020-01-22 NOTE — Progress Notes (Signed)
Started this weekend Rash Was pulling down poison ivy bush and developed rash on arms/face/eyes Started some old prednisone (had three pills left from last year) which helped itching  Didn't get Vitamin D drawn, wanted $240 upfront, wouldn't bill insurance. Will start Vitamin C.

## 2020-01-22 NOTE — Progress Notes (Signed)
Patient ID: Joshua Kaiser, male   DOB: Dec 15, 1980, 39 y.o.   MRN: [id number] .Marland KitchenVirtual Visit via Video Note  I connected with Joshua Kaiser on 01/22/20 at  3:20 PM EDT by a video enabled telemedicine application and verified that I am speaking with the correct person using two identifiers.  Location: Patient: home Provider: clinic   I discussed the limitations of evaluation and management by telemedicine and the availability of in person appointments. The patient expressed understanding and agreed to proceed.  History of Present Illness: Pt is a 39 yo male who calls into the clinic with itchy rash after pulling down poison ivy this weekend. Rash is red raised papules on arms, face, eyelids, chest. He started prednisone that he had left over from another episode of this from last year. It has helped with itching. No SOB or vision changes.   .. Active Ambulatory Problems    Diagnosis Date Noted  . Anxiety 11/04/2012  . Hypothyroidism 11/04/2012  . Right knee meniscal tear 11/04/2012  . 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 Special screening examination for neoplasm of prostate 02/19/2015  . Low energy 01/06/2017  . Injury of left shoulder 01/19/2017  . 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 PTSD (post-traumatic stress disorder) 07/11/2017  . Elevated bilirubin 04/18/2018  . Testicular pain, left 04/14/2019  . Recurrent major depressive disorder, in partial remission (HCC) 04/14/2019  . H/O vasectomy 04/14/2019  . Family history of bipolar disorder 08/01/2019  . Unilateral groin pain, left 01/22/2020  . Flank pain 01/22/2020  . Inattention 01/22/2020  . Chronic fatigue 01/22/2020  . History of ADHD 01/22/2020  . 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 Obsessional thoughts 01/22/2020  . Poison ivy dermatitis 01/22/2020   Resolved Ambulatory Problems    Diagnosis Date Noted  . No Resolved  Ambulatory Problems   No Additional Past Medical History   REviewed med, allergy, problem list.     Observations/Objective: No acute distress. Linear erythematous papules and vesicles on lower arms, chest.  Normal breathing.  No cough.   .. Today's Vitals   01/22/20 1439  Temp: 98 F (36.7 C)  TempSrc: Oral  Weight: 202 lb (91.6 kg)  Height: 6' (1.829 m)   Body mass index is 27.4 kg/m.    Assessment and Plan: Marland KitchenMarland KitchenJeniel was seen today for rash.  Diagnoses and all orders for this visit:  Poison ivy dermatitis -     predniSONE (STERAPRED UNI-PAK 21 TAB) 10 MG (21) TBPK tablet; Take tapering dosage over 6 days as directed   Sent prednisone taper. Discussed benadryl for itching if needed. Has topical steroid to use as needed for rash as well. Cool compresses. Follow up as needed.    Follow Up Instructions:    I discussed the assessment and treatment plan with the patient. The patient was provided an opportunity to ask questions and all were answered. The patient agreed with the plan and demonstrated an understanding of the instructions.   The patient was advised to call back or seek an in-person evaluation if the symptoms worsen or if the condition fails to improve as anticipated.  I provided 10 minutes of non-face-to-face time during this encounter.   Tandy Gaw, PA-C

## 2020-02-16 ENCOUNTER — Ambulatory Visit: Payer: 59 | Admitting: Physician Assistant

## 2020-02-26 ENCOUNTER — Other Ambulatory Visit: Payer: Self-pay | Admitting: Neurology

## 2020-02-26 DIAGNOSIS — F428 Other obsessive-compulsive disorder: Secondary | ICD-10-CM

## 2020-02-26 DIAGNOSIS — R41840 Attention and concentration deficit: Secondary | ICD-10-CM

## 2020-02-26 DIAGNOSIS — Z8659 Personal history of other mental and behavioral disorders: Secondary | ICD-10-CM

## 2020-02-26 DIAGNOSIS — F6389 Other impulse disorders: Secondary | ICD-10-CM

## 2020-02-26 MED ORDER — AMPHETAMINE-DEXTROAMPHET ER 10 MG PO CP24: 10.0000 mg | ORAL_CAPSULE | Freq: Every day | ORAL | 0 refills | Status: DC

## 2020-02-26 NOTE — Telephone Encounter (Signed)
Patient requesting refill on Adderall. Seen in March.

## 2020-02-27 ENCOUNTER — Other Ambulatory Visit: Payer: Self-pay | Admitting: *Deleted

## 2020-02-27 ENCOUNTER — Encounter: Payer: Self-pay | Admitting: Physician Assistant

## 2020-03-15 ENCOUNTER — Other Ambulatory Visit: Payer: Self-pay

## 2020-03-15 ENCOUNTER — Ambulatory Visit (INDEPENDENT_AMBULATORY_CARE_PROVIDER_SITE_OTHER): Payer: 59

## 2020-03-15 DIAGNOSIS — R1032 Left lower quadrant pain: Secondary | ICD-10-CM | POA: Diagnosis not present

## 2020-04-05 ENCOUNTER — Ambulatory Visit (INDEPENDENT_AMBULATORY_CARE_PROVIDER_SITE_OTHER): Payer: 59 | Admitting: Physician Assistant

## 2020-04-05 ENCOUNTER — Other Ambulatory Visit: Payer: Self-pay

## 2020-04-05 ENCOUNTER — Encounter: Payer: Self-pay | Admitting: Physician Assistant

## 2020-04-05 VITALS — BP 111/67 | HR 72 | Ht 72.0 in | Wt 193.0 lb

## 2020-04-05 DIAGNOSIS — F6389 Other impulse disorders: Secondary | ICD-10-CM

## 2020-04-05 DIAGNOSIS — E031 Congenital hypothyroidism without goiter: Secondary | ICD-10-CM

## 2020-04-05 DIAGNOSIS — F419 Anxiety disorder, unspecified: Secondary | ICD-10-CM

## 2020-04-05 DIAGNOSIS — R1032 Left lower quadrant pain: Secondary | ICD-10-CM

## 2020-04-05 DIAGNOSIS — R41840 Attention and concentration deficit: Secondary | ICD-10-CM | POA: Diagnosis not present

## 2020-04-05 DIAGNOSIS — F428 Other obsessive-compulsive disorder: Secondary | ICD-10-CM

## 2020-04-05 DIAGNOSIS — F902 Attention-deficit hyperactivity disorder, combined type: Secondary | ICD-10-CM | POA: Diagnosis not present

## 2020-04-05 DIAGNOSIS — Z8659 Personal history of other mental and behavioral disorders: Secondary | ICD-10-CM | POA: Diagnosis not present

## 2020-04-05 MED ORDER — AMPHETAMINE-DEXTROAMPHET ER 10 MG PO CP24: 10.0000 mg | ORAL_CAPSULE | Freq: Every day | ORAL | 0 refills | Status: DC

## 2020-04-05 MED ORDER — LEVOTHYROXINE SODIUM 150 MCG PO TABS: ORAL_TABLET | ORAL | 3 refills | Status: DC

## 2020-04-05 NOTE — Progress Notes (Signed)
Subjective:    Patient ID: Joshua Kaiser, male    DOB: [birth year], 39 y.o.   MRN: [id number]  HPI  Patient is a 39 year old male with ADHD who presents to the clinic for 1 month follow-up after starting Adderall.  Patient is doing really well with his Adderall.  He feels like it is helping his mood significantly.  He feels like he is having less anxiety and depression.  He also feels like he is more effective at work and less irritable.  He denies any palpitations, headaches, shortness of breath, insomnia.  He would like a refill today.  Patient continues to have some left lower inguinal pain that radiates into his left testicle.  He did have a CT abdomen pelvis that did not show any hernia or cause of his pain.  This pain has been fairly persistent since his vasectomy.  He feels like something has to be wrong.  He denies any problems with urination or intercourse.   .. Active Ambulatory Problems    Diagnosis Date Noted  . Anxiety 11/04/2012  . Hypothyroidism 11/04/2012  . Right knee meniscal tear 11/04/2012  . 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 Special screening examination for neoplasm of prostate 02/19/2015  . Low energy 01/06/2017  . Injury of left shoulder 01/19/2017  . Moderate major depression ([place]) 07/11/2017  . PTSD (post-traumatic stress disorder) 07/11/2017  . Elevated bilirubin 04/18/2018  . Testicular pain, left 04/14/2019  . Recurrent major depressive disorder, in partial remission ([place]) 04/14/2019  . H/O vasectomy 04/14/2019  . Family history of bipolar disorder 08/01/2019  . Unilateral groin pain, left 01/22/2020  . Flank pain 01/22/2020  . Inattention 01/22/2020  . Chronic fatigue 01/22/2020  . History of ADHD 01/22/2020  . 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 Obsessional thoughts 01/22/2020  . Poison ivy dermatitis 01/22/2020   Resolved Ambulatory Problems    Diagnosis  Date Noted  . No Resolved Ambulatory Problems   No Additional Past Medical History      Review of Systems  All other systems reviewed and are negative.      Objective:   Physical Exam Constitutional:      Appearance: Normal appearance.  HENT:     Head: Normocephalic.  Cardiovascular:     Rate and Rhythm: Normal rate and regular rhythm.     Pulses: Normal pulses.  Pulmonary:     Effort: Pulmonary effort is normal.     Breath sounds: Normal breath sounds.  Neurological:     General: No focal deficit present.     Mental Status: He is alert and oriented to person, place, and time.  Psychiatric:        Mood and Affect: Mood normal.    .. Depression screen [facility] 2/9 04/05/2020 01/19/2020 10/27/2019 04/14/2019 10/14/2018  Decreased Interest 1 0 0 0 1  Down, Depressed, Hopeless 0 0 0 0 2  PHQ - 2 Score 1 0 0 0 3  Altered sleeping 0 2 - 1 2  Tired, decreased energy 1 1 - 2 2  Change in appetite 2 0 - 2 2  Feeling bad or failure about yourself  0 0 - 0 2  Trouble concentrating 0 2 - 1 2  Moving slowly or fidgety/restless 0 0 - 0 0  Suicidal thoughts 0 0 - 0 0  PHQ-9 Score 4 5 - 6 13  Difficult doing work/chores Not difficult at all Somewhat difficult - Somewhat difficult Very difficult   .Marland Kitchen  GAD 7 : Generalized Anxiety Score 04/05/2020 01/19/2020 10/27/2019 04/14/2019  Nervous, Anxious, on Edge 1 2 3 1   Control/stop worrying 0 1 3 1   Worry too much - different things 1 1 3 1   Trouble relaxing 0 2 0 1  Restless 0 1 0 1  Easily annoyed or irritable 0 1 0 0  Afraid - awful might happen 0 0 0 0  Total GAD 7 Score 2 8 9 5   Anxiety Difficulty Not difficult at all Somewhat difficult Not difficult at all Somewhat difficult           Assessment & Plan:   Joshua Kaiser was seen today for follow-up.  Diagnoses and all orders for this visit:  Attention deficit hyperactivity disorder (ADHD), combined type -     amphetamine-dextroamphetamine (ADDERALL XR) 10 MG 24 hr capsule; Take 1  capsule (10 mg total) by mouth daily. -     amphetamine-dextroamphetamine (ADDERALL XR) 10 MG 24 hr capsule; Take 1 capsule (10 mg total) by mouth daily. -     amphetamine-dextroamphetamine (ADDERALL XR) 10 MG 24 hr capsule; Take 1 capsule (10 mg total) by mouth daily.  Inattention  History of ADHD -     amphetamine-dextroamphetamine (ADDERALL XR) 10 MG 24 hr capsule; Take 1 capsule (10 mg total) by mouth daily. -     amphetamine-dextroamphetamine (ADDERALL XR) 10 MG 24 hr capsule; Take 1 capsule (10 mg total) by mouth daily. -     amphetamine-dextroamphetamine (ADDERALL XR) 10 MG 24 hr capsule; Take 1 capsule (10 mg total) by mouth daily.  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 -     amphetamine-dextroamphetamine (ADDERALL XR) 10 MG 24 hr capsule; Take 1 capsule (10 mg total) by mouth daily. -     amphetamine-dextroamphetamine (ADDERALL XR) 10 MG 24 hr capsule; Take 1 capsule (10 mg total) by mouth daily.  Obsessional thoughts -     amphetamine-dextroamphetamine (ADDERALL XR) 10 MG 24 hr capsule; Take 1 capsule (10 mg total) by mouth daily.  Anxiety  Congenital hypothyroidism without goiter -     levothyroxine (SYNTHROID) 150 MCG tablet; 1 tablet daily, 1.5 tablets on Saturday  Left inguinal pain -     Ambulatory referral to Urology   Adderall refilled at same dose for 3 months.  Patient is doing great.  PHQ-9 and GAD-7 drastically improved.  Patient suspects likely due to starting Adderall.  We will make referral to urology.  Reassured that CT scan did not show any cause of symptoms.  TSH recently checked.  Refilled for a year.

## 2020-04-05 NOTE — Patient Instructions (Signed)
Will refer to urology

## 2020-04-16 ENCOUNTER — Other Ambulatory Visit: Payer: Self-pay | Admitting: Physician Assistant

## 2020-04-16 DIAGNOSIS — F419 Anxiety disorder, unspecified: Secondary | ICD-10-CM

## 2020-04-17 ENCOUNTER — Encounter: Payer: Self-pay | Admitting: Physician Assistant

## 2020-04-17 NOTE — Telephone Encounter (Signed)
I made referral to urology. Can we see why they have not scheduled with patient.

## 2020-04-17 NOTE — Telephone Encounter (Signed)
Arline Asp is gone for today and I see where she did send this referral to Urology, Arline Asp can you see if they have scheduled patient

## 2020-04-18 NOTE — Telephone Encounter (Signed)
I called [facility] they are calling patient today to get him scheduled - CF

## 2020-06-28 ENCOUNTER — Other Ambulatory Visit: Payer: Self-pay

## 2020-06-28 ENCOUNTER — Ambulatory Visit (INDEPENDENT_AMBULATORY_CARE_PROVIDER_SITE_OTHER): Payer: 59 | Admitting: Physician Assistant

## 2020-06-28 ENCOUNTER — Encounter: Payer: Self-pay | Admitting: Physician Assistant

## 2020-06-28 VITALS — BP 111/73 | HR 85 | Ht 72.0 in | Wt 185.0 lb

## 2020-06-28 DIAGNOSIS — F428 Other obsessive-compulsive disorder: Secondary | ICD-10-CM | POA: Diagnosis not present

## 2020-06-28 DIAGNOSIS — F321 Major depressive disorder, single episode, moderate: Secondary | ICD-10-CM

## 2020-06-28 DIAGNOSIS — F4310 Post-traumatic stress disorder, unspecified: Secondary | ICD-10-CM

## 2020-06-28 DIAGNOSIS — F6389 Other impulse disorders: Secondary | ICD-10-CM

## 2020-06-28 DIAGNOSIS — F902 Attention-deficit hyperactivity disorder, combined type: Secondary | ICD-10-CM | POA: Diagnosis not present

## 2020-06-28 MED ORDER — AMPHETAMINE-DEXTROAMPHET ER 5 MG PO CP24: 5.0000 mg | ORAL_CAPSULE | Freq: Every day | ORAL | 0 refills | Status: DC

## 2020-06-28 NOTE — Progress Notes (Signed)
Subjective:    Patient ID: Joshua Kaiser, male    DOB: 08/05/1981, 39 y.o.   MRN: [id number]  HPI  Pt is a 39 yo male with hypothyroidism, ADHD, MDD, PTSD who presents to the clinic for follow up on medication.   He is doing much better on adderall. Over the past few months he has noticed a little increase in agitation and using xanax more. He has also had some problem sleeping. No SI/HC. He still loves the production and focus and overall mood is better. He wonders if dose of adderall could be decreased.   .. Active Ambulatory Problems    Diagnosis Date Noted  . Anxiety 11/04/2012  . Hypothyroidism 11/04/2012  . Right knee meniscal tear 11/04/2012  . 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 Special screening examination for neoplasm of prostate 02/19/2015  . Low energy 01/06/2017  . Injury of left shoulder 01/19/2017  . 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 PTSD (post-traumatic stress disorder) 07/11/2017  . Elevated bilirubin 04/18/2018  . Testicular pain, left 04/14/2019  . Recurrent major depressive disorder, in partial remission (HCC) 04/14/2019  . H/O vasectomy 04/14/2019  . Family history of bipolar disorder 08/01/2019  . Unilateral groin pain, left 01/22/2020  . Flank pain 01/22/2020  . Inattention 01/22/2020  . Chronic fatigue 01/22/2020  . History of ADHD 01/22/2020  . 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 Obsessional thoughts 01/22/2020  . Attention deficit hyperactivity disorder (ADHD), combined type 04/05/2020   Resolved Ambulatory Problems    Diagnosis Date Noted  . Poison ivy dermatitis 01/22/2020   No Additional Past Medical History       Review of Systems  All other systems reviewed and are negative.      Objective:   Physical Exam Vitals reviewed.  Constitutional:      Appearance: Normal appearance.  Cardiovascular:     Rate and Rhythm: Normal rate  and regular rhythm.     Pulses: Normal pulses.     Heart sounds: Normal heart sounds.  Pulmonary:     Effort: Pulmonary effort is normal.     Breath sounds: Normal breath sounds.  Neurological:     General: No focal deficit present.     Mental Status: He is alert and oriented to person, place, and time.  Psychiatric:        Mood and Affect: Mood normal.          .. Depression screen [facility] 2/9 04/05/2020 01/19/2020 10/27/2019 04/14/2019 10/14/2018  Decreased Interest 1 0 0 0 1  Down, Depressed, Hopeless 0 0 0 0 2  PHQ - 2 Score 1 0 0 0 3  Altered sleeping 0 2 - 1 2  Tired, decreased energy 1 1 - 2 2  Change in appetite 2 0 - 2 2  Feeling bad or failure about yourself  0 0 - 0 2  Trouble concentrating 0 2 - 1 2  Moving slowly or fidgety/restless 0 0 - 0 0  Suicidal thoughts 0 0 - 0 0  PHQ-9 Score 4 5 - 6 13  Difficult doing work/chores Not difficult at all Somewhat difficult - Somewhat difficult Very difficult   .Marland Kitchen GAD 7 : Generalized Anxiety Score 04/05/2020 01/19/2020 10/27/2019 04/14/2019  Nervous, Anxious, on Edge 1 2 3 1   Control/stop worrying 0 1 3 1   Worry too much - different things 1 1 3 1   Trouble relaxing 0 2 0 1  Restless 0 1 0  1  Easily annoyed or irritable 0 1 0 0  Afraid - awful might happen 0 0 0 0  Total GAD 7 Score 2 8 9 5   Anxiety Difficulty Not difficult at all Somewhat difficult Not difficult at all Somewhat difficult     Assessment & Plan:  Marland KitchenDiagnoses and all orders for this visit:  Attention deficit hyperactivity disorder (ADHD), combined type -     amphetamine-dextroamphetamine (ADDERALL XR) 5 MG 24 hr capsule; Take 1 capsule (5 mg total) by mouth daily.  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   Decreased adderall to 5mg . Xanax sparingly. Resistant to SSRI with combination due to past side effects and failure to benefit.  Follow up in 3 months.  Discussed potential trial of vyvanse/mydayis in future.  Discussed sleep routine.

## 2020-06-28 NOTE — Patient Instructions (Signed)
vyvanse mydayis  concerta

## 2020-07-15 ENCOUNTER — Other Ambulatory Visit: Payer: Self-pay | Admitting: Physician Assistant

## 2020-07-15 DIAGNOSIS — F419 Anxiety disorder, unspecified: Secondary | ICD-10-CM

## 2020-07-16 NOTE — Telephone Encounter (Signed)
Last written 04/16/2020 #180 with no refills Last appt 06/28/2020

## 2020-07-30 ENCOUNTER — Other Ambulatory Visit: Payer: Self-pay | Admitting: Neurology

## 2020-07-30 DIAGNOSIS — F902 Attention-deficit hyperactivity disorder, combined type: Secondary | ICD-10-CM

## 2020-07-30 DIAGNOSIS — F6389 Other impulse disorders: Secondary | ICD-10-CM

## 2020-07-30 MED ORDER — AMPHETAMINE-DEXTROAMPHET ER 5 MG PO CP24: 5.0000 mg | ORAL_CAPSULE | Freq: Every day | ORAL | 0 refills | Status: DC

## 2020-07-30 MED ORDER — AMPHETAMINE-DEXTROAMPHET ER 5 MG PO CP24
5.0000 mg | ORAL_CAPSULE | Freq: Every day | ORAL | 0 refills | Status: DC
Start: 2020-08-28 — End: 2020-09-27

## 2020-07-30 MED ORDER — AMPHETAMINE-DEXTROAMPHET ER 5 MG PO CP24
5.0000 mg | ORAL_CAPSULE | Freq: Every day | ORAL | 0 refills | Status: DC
Start: 2020-09-27 — End: 2020-09-26

## 2020-07-30 NOTE — Telephone Encounter (Signed)
Patient's wife made aware.

## 2020-07-30 NOTE — Telephone Encounter (Signed)
Patient's wife left vm stating only 1 month was sent in of Adderall RX last month (5 mg). She is asking for refills to be sent. Pended. Please review.

## 2020-08-18 IMAGING — US ULTRASOUND SCROTUM DOPPLER COMPLETE
1 series · 13 of 25 positions shown · non-contrast
Comparison: None.

CLINICAL DATA: Left testicular pain for 3 weeks. Remote history of
a vasectomy.

EXAM:
SCROTAL ULTRASOUND
DOPPLER ULTRASOUND OF THE TESTICLES
TECHNIQUE: Complete ultrasound examination of the testicles, epididymis, and
other scrotal structures was performed. Color and spectral Doppler
ultrasound were also utilized to evaluate blood flow to the
testicles.

[Series 2: ultrasound scrotum doppler complete · 0.07mm/px · 13 of 111 slices shown]
[im 1/111]
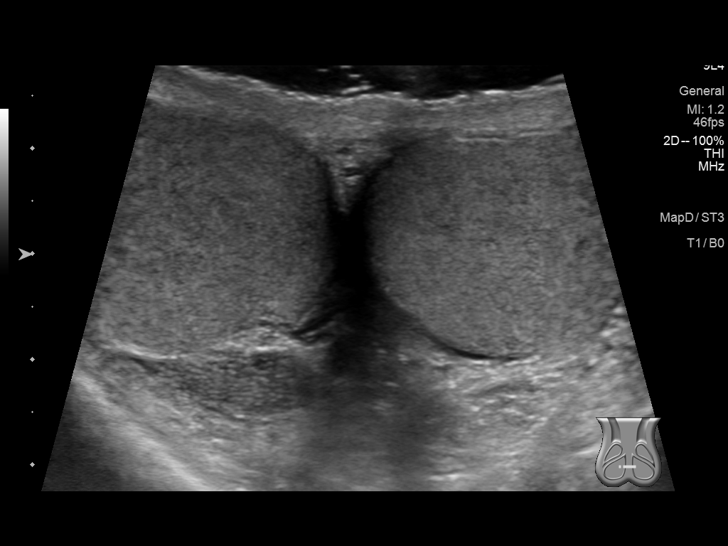
[im 10/111]
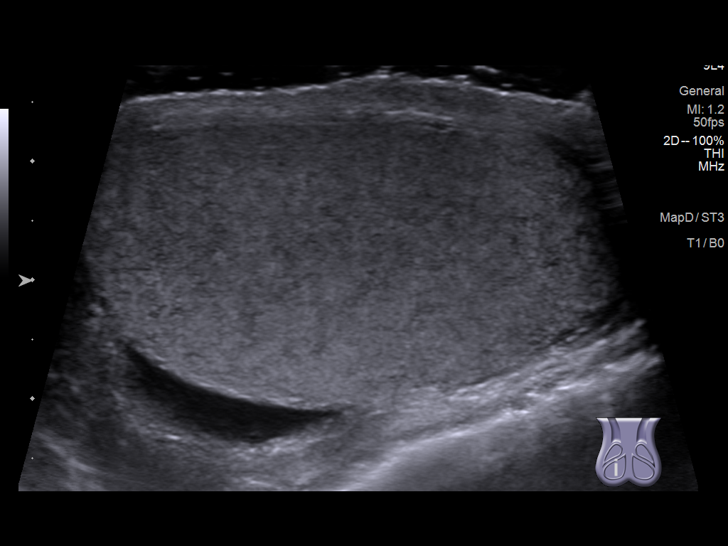
[im 19/111]
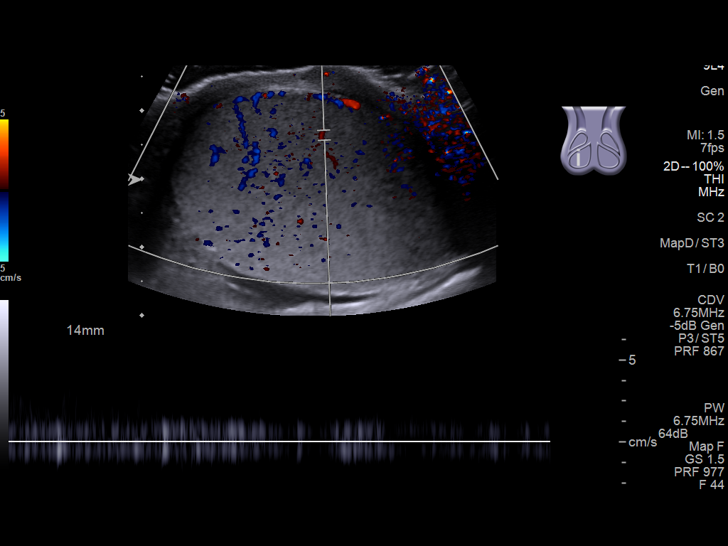
[im 28/111]
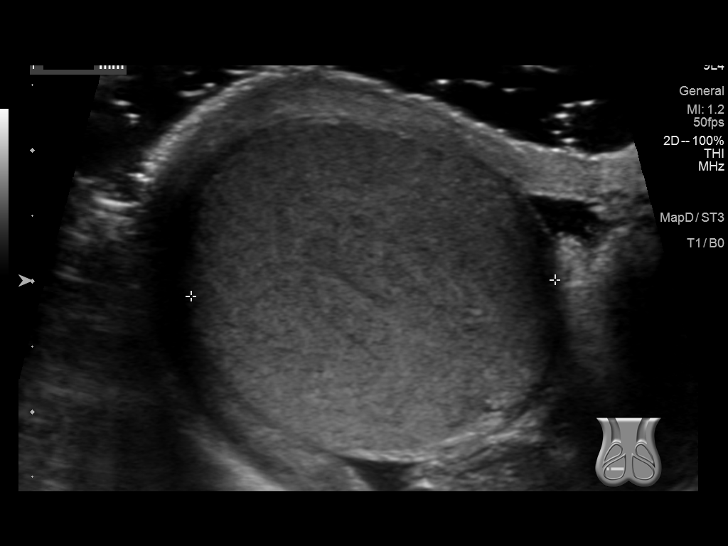
[im 37/111]
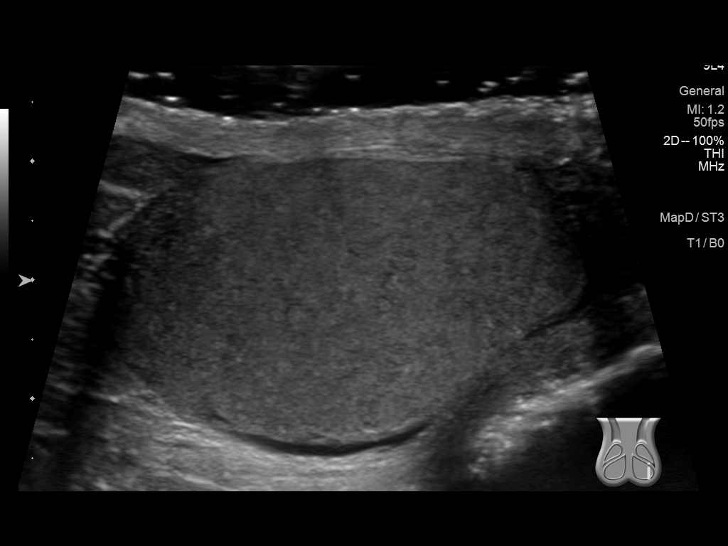
[im 46/111]
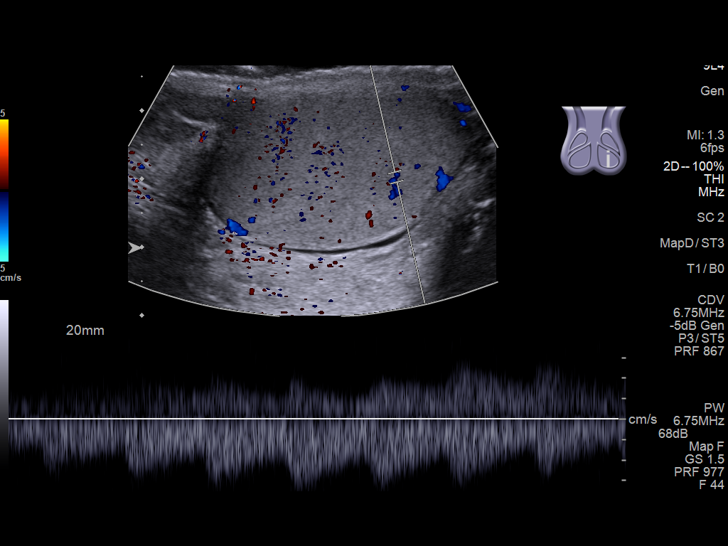
[im 56/111]
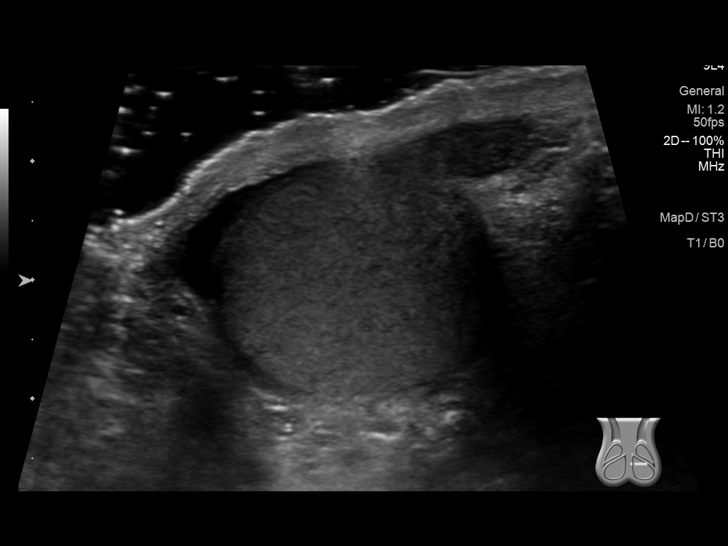
[im 65/111]
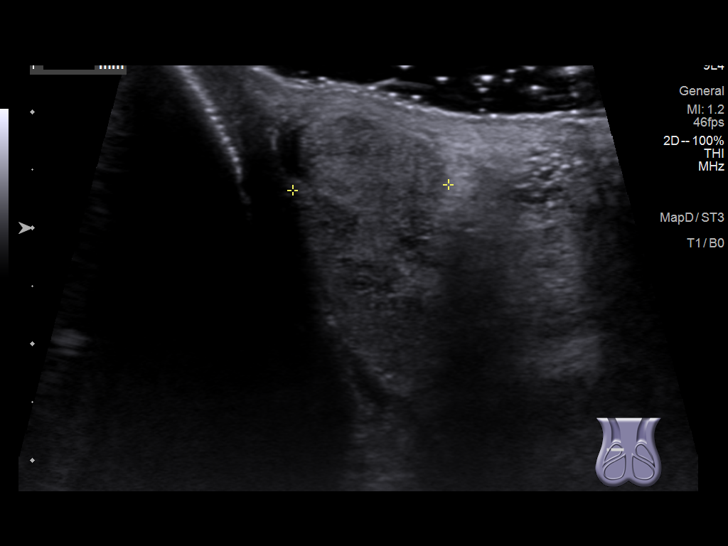
[im 74/111]
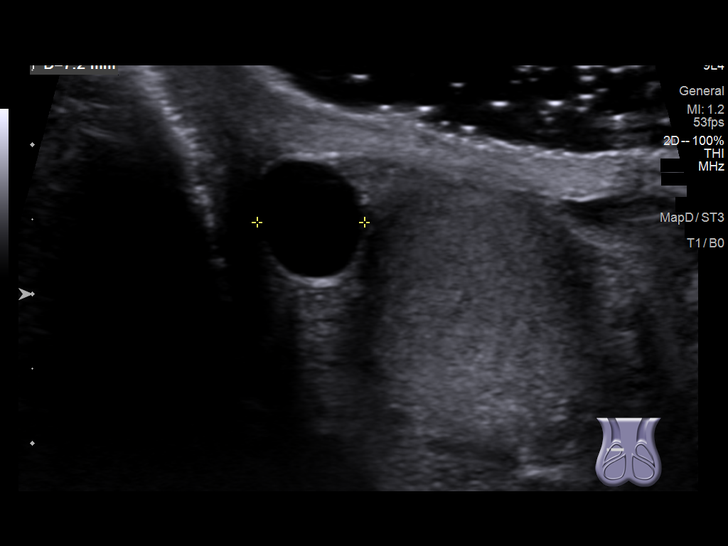
[im 83/111]
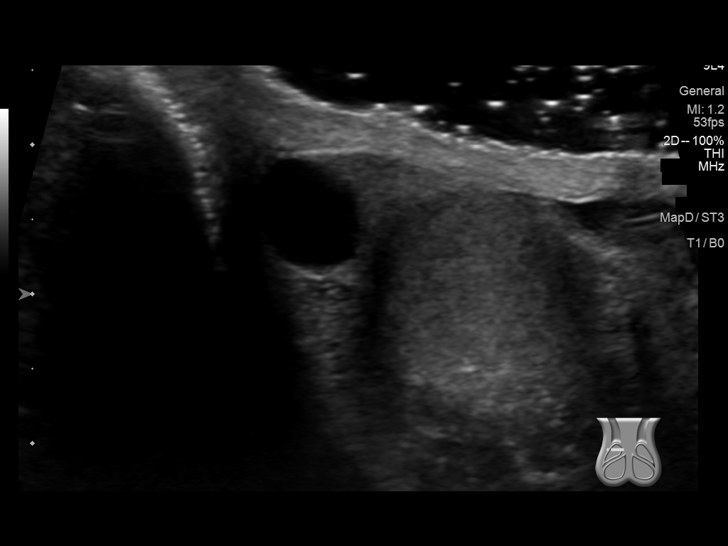
[im 92/111]
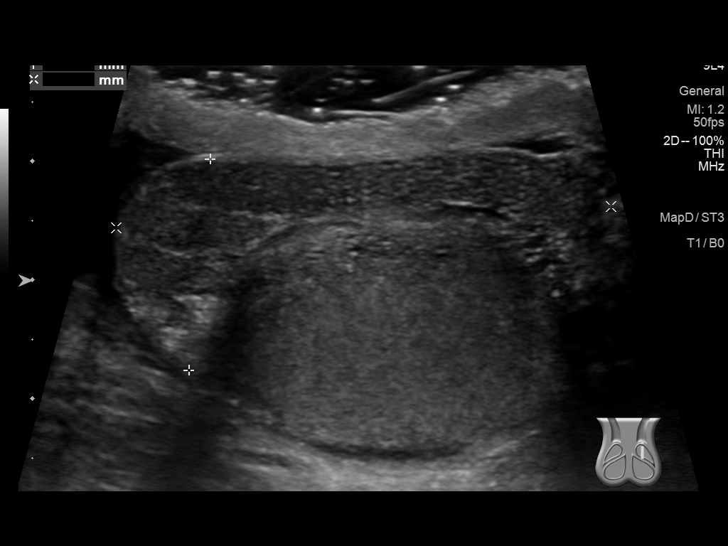
[im 101/111]
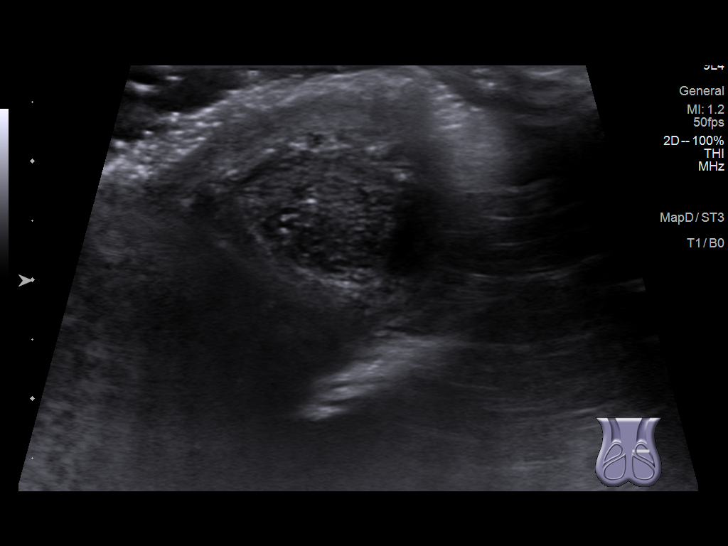
[im 111/111]
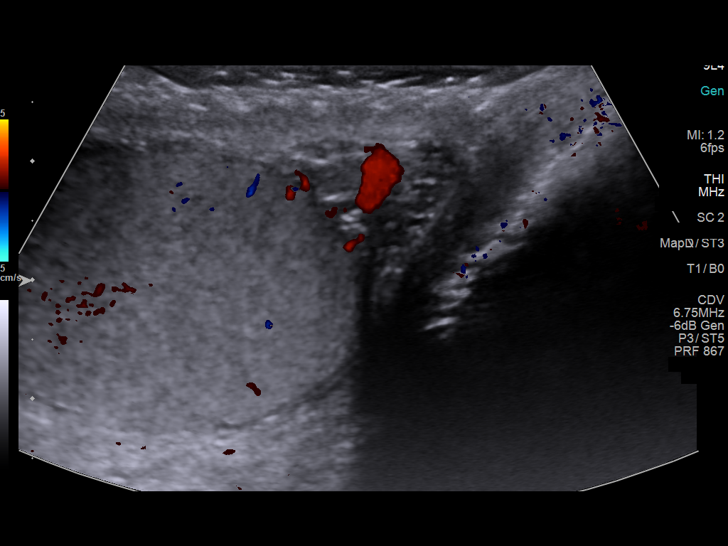

[13 of 25 positions shown; findings below may reference images not displayed]

FINDINGS: Right testicle

Measurements: 4.5 x 2.4 x 2.8 cm. Symmetric and homogeneous
echotexture without focal lesion. Patent arterial and venous blood
flow.

Left testicle

Measurements: 4.5 x 2.4 x 3.1 cm. Symmetric and homogeneous
echotexture without focal lesion. Patent arterial and venous blood
flow.

Right epididymis: The epididymis is prominent and has a somewhat
honeycomb appearance. This is most typically seen with tubular
ectasia and can be seen post vasectomy. Small epididymal cysts
measuring 9 x 8 x 7 mm.

Left epididymis: Prominent epididymis with a somewhat honey colon
appearance. This is symmetric with the right epididymis and is most
likely tubular ectasia.

Hydrocele:  None visualized.

Varicocele:  None visualized.

Pulsed Doppler interrogation of both testes demonstrates normal low
resistance arterial and venous waveforms bilaterally.
IMPRESSION: 1. Normal sonographic appearance of both testicles. No cystic or
mass lesions and patent intratesticular blood flow.
2. Mildly enlarged epididymi bilaterally with findings consistent
with tubular ectasia. This is most typically seen post vasectomy.
3. Small epididymal cyst involving the right epididymal head.
4. No scrotal lesions.

## 2020-09-27 ENCOUNTER — Other Ambulatory Visit: Payer: Self-pay

## 2020-09-27 ENCOUNTER — Ambulatory Visit (INDEPENDENT_AMBULATORY_CARE_PROVIDER_SITE_OTHER): Payer: 59 | Admitting: Physician Assistant

## 2020-09-27 ENCOUNTER — Encounter: Payer: Self-pay | Admitting: Physician Assistant

## 2020-09-27 DIAGNOSIS — F419 Anxiety disorder, unspecified: Secondary | ICD-10-CM

## 2020-09-27 DIAGNOSIS — F6389 Other impulse disorders: Secondary | ICD-10-CM

## 2020-09-27 DIAGNOSIS — F902 Attention-deficit hyperactivity disorder, combined type: Secondary | ICD-10-CM | POA: Diagnosis not present

## 2020-09-27 MED ORDER — AMPHETAMINE-DEXTROAMPHET ER 5 MG PO CP24: 5.0000 mg | ORAL_CAPSULE | Freq: Every day | ORAL | 0 refills | Status: DC

## 2020-09-27 MED ORDER — ALPRAZOLAM 1 MG PO TABS: ORAL_TABLET | ORAL | 1 refills | Status: DC

## 2020-09-27 NOTE — Progress Notes (Signed)
Subjective:    Patient ID: Joshua Kaiser, male    DOB: 1981-02-11, 39 y.o.   MRN: [id number]  HPI  Patient is a 39 year old male with ADHD and anxiety who presents to the clinic for medication refills.  At last visit we consider switching to Vyvanse from Adderall.  He has now worked well tolerated at the 5 mg dose.  He is doing great.  He is sleeping.  He denies any palpitations or headaches.  His anxiety is well controlled.  He does need refills.  .. Active Ambulatory Problems    Diagnosis Date Noted  . Anxiety 11/04/2012  . Hypothyroidism 11/04/2012  . Right knee meniscal tear 11/04/2012  . 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 Special screening examination for neoplasm of prostate 02/19/2015  . Low energy 01/06/2017  . Injury of left shoulder 01/19/2017  . 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 PTSD (post-traumatic stress disorder) 07/11/2017  . Elevated bilirubin 04/18/2018  . Testicular pain, left 04/14/2019  . Recurrent major depressive disorder, in partial remission (HCC) 04/14/2019  . H/O vasectomy 04/14/2019  . Family history of bipolar disorder 08/01/2019  . Unilateral groin pain, left 01/22/2020  . Flank pain 01/22/2020  . Inattention 01/22/2020  . Chronic fatigue 01/22/2020  . History of ADHD 01/22/2020  . 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 Obsessional thoughts 01/22/2020  . Attention deficit hyperactivity disorder (ADHD), combined type 04/05/2020   Resolved Ambulatory Problems    Diagnosis Date Noted  . Poison ivy dermatitis 01/22/2020   No Additional Past Medical History       Review of Systems  All other systems reviewed and are negative.      Objective:   Physical Exam Vitals reviewed.  Constitutional:      Appearance: Normal appearance.  Cardiovascular:     Rate and Rhythm: Normal rate and regular rhythm.     Pulses: Normal pulses.  Pulmonary:      Effort: Pulmonary effort is normal.  Neurological:     General: No focal deficit present.     Mental Status: He is alert and oriented to person, place, and time.  Psychiatric:        Mood and Affect: Mood normal.       .. Depression screen [facility] 2/9 09/27/2020 04/05/2020 01/19/2020 10/27/2019 04/14/2019  Decreased Interest 0 1 0 0 0  Down, Depressed, Hopeless 0 0 0 0 0  PHQ - 2 Score 0 1 0 0 0  Altered sleeping 0 0 2 - 1  Tired, decreased energy 0 1 1 - 2  Change in appetite 1 2 0 - 2  Feeling bad or failure about yourself  1 0 0 - 0  Trouble concentrating 0 0 2 - 1  Moving slowly or fidgety/restless 0 0 0 - 0  Suicidal thoughts 0 0 0 - 0  PHQ-9 Score 2 4 5  - 6  Difficult doing work/chores Not difficult at all Not difficult at all Somewhat difficult - Somewhat difficult   . GAD 7 : Generalized Anxiety Score 09/27/2020 04/05/2020 01/19/2020 10/27/2019  Nervous, Anxious, on Edge 1 1 2 3   Control/stop worrying 1 0 1 3  Worry too much - different things 1 1 1 3   Trouble relaxing 0 0 2 0  Restless 0 0 1 0  Easily annoyed or irritable 0 0 1 0  Afraid - awful might happen 0 0 0 0  Total GAD 7 Score 3 2 8  9  Anxiety Difficulty Not difficult at all Not difficult at all Somewhat difficult Not difficult at all        Assessment & Plan:  Marland KitchenMarland KitchenKathan was seen today for adhd.  Diagnoses and all orders for this visit:  Attention deficit hyperactivity disorder (ADHD), combined type -     amphetamine-dextroamphetamine (ADDERALL XR) 5 MG 24 hr capsule; Take 1 capsule (5 mg total) by mouth daily. -     amphetamine-dextroamphetamine (ADDERALL XR) 5 MG 24 hr capsule; Take 1 capsule (5 mg total) by mouth daily. -     amphetamine-dextroamphetamine (ADDERALL XR) 5 MG 24 hr capsule; Take 1 capsule (5 mg total) by mouth daily.  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  Anxiety -     ALPRAZolam (XANAX) 1 MG tablet; TAKE ONE TABLET BY MOUTH UP TO TWICE A DAY AS NEEDED FOR ANXIETY.   PHQ-9 and GAD-7 are controlled.  Refilled medicines.  Will okay refills for 6 months. Well tolerated regimen.

## 2021-01-10 ENCOUNTER — Encounter: Payer: Self-pay | Admitting: Physician Assistant

## 2021-01-10 ENCOUNTER — Telehealth (INDEPENDENT_AMBULATORY_CARE_PROVIDER_SITE_OTHER): Payer: 59 | Admitting: Physician Assistant

## 2021-01-10 VITALS — Temp 98.5°F | Ht 72.0 in | Wt 174.0 lb

## 2021-01-10 DIAGNOSIS — H1013 Acute atopic conjunctivitis, bilateral: Secondary | ICD-10-CM | POA: Diagnosis not present

## 2021-01-10 DIAGNOSIS — F6389 Other impulse disorders: Secondary | ICD-10-CM | POA: Diagnosis not present

## 2021-01-10 DIAGNOSIS — F902 Attention-deficit hyperactivity disorder, combined type: Secondary | ICD-10-CM

## 2021-01-10 MED ORDER — AMPHETAMINE-DEXTROAMPHET ER 5 MG PO CP24: 5.0000 mg | ORAL_CAPSULE | Freq: Every day | ORAL | 0 refills | Status: DC

## 2021-01-10 MED ORDER — METHYLPREDNISOLONE 4 MG PO TBPK: ORAL_TABLET | ORAL | 0 refills | Status: DC

## 2021-01-10 NOTE — Progress Notes (Signed)
Patient ID: Joshua Kaiser, male   DOB: Aug 12, 1981, 40 y.o.   MRN: [id number] .Marland KitchenVirtual Visit via Video Note  I connected with Joshua Kaiser on 01/10/21 at 11:30 AM EST by a video enabled telemedicine application and verified that I am speaking with the correct person using two identifiers.  Location: Patient: home Provider: clinic  .Marland KitchenParticipating in visit:  Patient: Joshua Kaiser Provider: Tandy Gaw PA-C   I discussed the limitations of evaluation and management by telemedicine and the availability of in person appointments. The patient expressed understanding and agreed to proceed.  History of Present Illness: Pt is a 40 yo male who presents to the clinic with bilateral eyelid swelling, itching, watery discharge. Benadryl and zyrtec helping. No fever, chills, body aches. No vision changes. Hx of itchy eyes this time of year.   Needs refills on adderall for mood, focus, sensory issues. Doing great. No palpitations, headaches, insomnia, increase in anxiety.    . Active Ambulatory Problems    Diagnosis Date Noted  . Anxiety 11/04/2012  . Hypothyroidism 11/04/2012  . Right knee meniscal tear 11/04/2012  . 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 Special screening examination for neoplasm of prostate 02/19/2015  . Low energy 01/06/2017  . Injury of left shoulder 01/19/2017  . 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 PTSD (post-traumatic stress disorder) 07/11/2017  . Elevated bilirubin 04/18/2018  . Testicular pain, left 04/14/2019  . Recurrent major depressive disorder, in partial remission (HCC) 04/14/2019  . H/O vasectomy 04/14/2019  . Family history of bipolar disorder 08/01/2019  . Unilateral groin pain, left 01/22/2020  . Flank pain 01/22/2020  . Inattention 01/22/2020  . Chronic fatigue 01/22/2020  . History of ADHD 01/22/2020  . 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 Obsessional  thoughts 01/22/2020  . Attention deficit hyperactivity disorder (ADHD), combined type 04/05/2020   Resolved Ambulatory Problems    Diagnosis Date Noted  . Poison ivy dermatitis 01/22/2020   No Additional Past Medical History   Reviewed med, allergy, problem list.   Observations/Objective: No acute distress Normal breathing Red external swollen, erythematous upper and lower eyelids.  Conjunctiva white with some watery discharge.   .. Today's Vitals   01/10/21 1123  Temp: 98.5 F (36.9 C)  TempSrc: Oral  Weight: 174 lb (78.9 kg)  Height: 6' (1.829 m)   Body mass index is 23.6 kg/m.    Assessment and Plan: Marland KitchenMarland KitchenDinesh was seen today for eye problem.  Diagnoses and all orders for this visit:  Allergic conjunctivitis of both eyes -     methylPREDNISolone (MEDROL DOSEPAK) 4 MG TBPK tablet; Take as directed by package insert.  Attention deficit hyperactivity disorder (ADHD), combined type -     amphetamine-dextroamphetamine (ADDERALL XR) 5 MG 24 hr capsule; Take 1 capsule (5 mg total) by mouth daily. -     amphetamine-dextroamphetamine (ADDERALL XR) 5 MG 24 hr capsule; Take 1 capsule (5 mg total) by mouth daily. -     amphetamine-dextroamphetamine (ADDERALL XR) 5 MG 24 hr capsule; Take 1 capsule (5 mg total) by mouth daily.  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   Appears like allergic conjunctivitis vs some external poison ivy on eyelids. Treated with medrol dose pack. Cool compresses. Benadryl for itching.   Doing well with mood and focus.  Refilled adderall.    Follow Up Instructions:  I discussed the assessment and treatment plan with the patient. The patient was provided an opportunity to ask questions and all were answered. The patient agreed with the plan and demonstrated an  understanding of the instructions.   The patient was advised to call back or seek an in-person evaluation if the symptoms worsen or if the condition fails to improve as anticipated.    Tandy Gaw, PA-C

## 2021-01-10 NOTE — Progress Notes (Signed)
Started 3 days ago Swelling, redness, L>R  Taking benadryl twice daily for the last three days  Today - better

## 2021-03-28 ENCOUNTER — Ambulatory Visit (INDEPENDENT_AMBULATORY_CARE_PROVIDER_SITE_OTHER): Payer: 59 | Admitting: Physician Assistant

## 2021-03-28 ENCOUNTER — Encounter: Payer: Self-pay | Admitting: Physician Assistant

## 2021-03-28 ENCOUNTER — Other Ambulatory Visit: Payer: Self-pay

## 2021-03-28 VITALS — BP 111/69 | HR 70 | Ht 72.0 in | Wt 175.0 lb

## 2021-03-28 DIAGNOSIS — F902 Attention-deficit hyperactivity disorder, combined type: Secondary | ICD-10-CM

## 2021-03-28 DIAGNOSIS — F419 Anxiety disorder, unspecified: Secondary | ICD-10-CM

## 2021-03-28 DIAGNOSIS — E039 Hypothyroidism, unspecified: Secondary | ICD-10-CM | POA: Diagnosis not present

## 2021-03-28 DIAGNOSIS — Z131 Encounter for screening for diabetes mellitus: Secondary | ICD-10-CM | POA: Diagnosis not present

## 2021-03-28 DIAGNOSIS — E031 Congenital hypothyroidism without goiter: Secondary | ICD-10-CM

## 2021-03-28 DIAGNOSIS — Z79899 Other long term (current) drug therapy: Secondary | ICD-10-CM | POA: Diagnosis not present

## 2021-03-28 DIAGNOSIS — Z13220 Encounter for screening for lipoid disorders: Secondary | ICD-10-CM

## 2021-03-28 DIAGNOSIS — F6389 Other impulse disorders: Secondary | ICD-10-CM

## 2021-03-28 MED ORDER — AMPHETAMINE-DEXTROAMPHET ER 5 MG PO CP24: 5.0000 mg | ORAL_CAPSULE | Freq: Every day | ORAL | 0 refills | Status: DC

## 2021-03-28 MED ORDER — LEVOTHYROXINE SODIUM 150 MCG PO TABS: ORAL_TABLET | ORAL | 3 refills | Status: DC

## 2021-03-28 MED ORDER — ALPRAZOLAM 1 MG PO TABS: ORAL_TABLET | ORAL | 1 refills | Status: DC

## 2021-03-28 NOTE — Progress Notes (Signed)
Subjective:    Patient ID: Joshua Kaiser, male    DOB: 07-01-1981, 40 y.o.   MRN: [id number]  HPI Patient is a 40 year old male with ADD, anxiety, hypothyroidism who presents to the clinic for medication refill.  Overall patient is doing okay.  His work is going well.  He is sleeping well.  He has no major concerns or complaints.  He does feel controlled on his medication but he would like to get off the Xanax.  He has tried to taper in the past but his anxiety has been too great.  He would like to discuss this today.  .. Active Ambulatory Problems    Diagnosis Date Noted  . Anxiety 11/04/2012  . Hypothyroidism 11/04/2012  . Right knee meniscal tear 11/04/2012  . 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 Special screening examination for neoplasm of prostate 02/19/2015  . Low energy 01/06/2017  . Injury of left shoulder 01/19/2017  . 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 PTSD (post-traumatic stress disorder) 07/11/2017  . Elevated bilirubin 04/18/2018  . Testicular pain, left 04/14/2019  . Recurrent major depressive disorder, in partial remission (HCC) 04/14/2019  . H/O vasectomy 04/14/2019  . Family history of bipolar disorder 08/01/2019  . Unilateral groin pain, left 01/22/2020  . Flank pain 01/22/2020  . Inattention 01/22/2020  . Chronic fatigue 01/22/2020  . History of ADHD 01/22/2020  . 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 Obsessional thoughts 01/22/2020  . Attention deficit hyperactivity disorder (ADHD), combined type 04/05/2020   Resolved Ambulatory Problems    Diagnosis Date Noted  . Poison ivy dermatitis 01/22/2020   No Additional Past Medical History    Review of Systems  All other systems reviewed and are negative.      Objective:   Physical Exam Vitals reviewed.  Constitutional:      Appearance: Normal appearance.  HENT:     Head: Normocephalic.   Cardiovascular:     Rate and Rhythm: Normal rate and regular rhythm.     Pulses: Normal pulses.     Heart sounds: Normal heart sounds. No murmur heard.   Pulmonary:     Effort: Pulmonary effort is normal.     Breath sounds: Normal breath sounds.  Musculoskeletal:     Cervical back: Neck supple.  Neurological:     General: No focal deficit present.     Mental Status: He is alert and oriented to person, place, and time.  Psychiatric:        Mood and Affect: Mood normal.      .. Depression screen [facility] 2/9 03/28/2021 09/27/2020 04/05/2020 01/19/2020 10/27/2019  Decreased Interest 0 0 1 0 0  Down, Depressed, Hopeless 0 0 0 0 0  PHQ - 2 Score 0 0 1 0 0  Altered sleeping - 0 0 2 -  Tired, decreased energy - 0 1 1 -  Change in appetite - 1 2 0 -  Feeling bad or failure about yourself  - 1 0 0 -  Trouble concentrating - 0 0 2 -  Moving slowly or fidgety/restless - 0 0 0 -  Suicidal thoughts - 0 0 0 -  PHQ-9 Score - 2 4 5  -  Difficult doing work/chores - Not difficult at all Not difficult at all Somewhat difficult -  Some recent data might be hidden   . GAD 7 : Generalized Anxiety Score 09/27/2020 04/05/2020 01/19/2020 10/27/2019  Nervous, Anxious, on Edge 1 1 2 3   Control/stop worrying  1 0 1 3  Worry too much - different things 1 1 1 3   Trouble relaxing 0 0 2 0  Restless 0 0 1 0  Easily annoyed or irritable 0 0 1 0  Afraid - awful might happen 0 0 0 0  Total GAD 7 Score 3 2 8 9   Anxiety Difficulty Not difficult at all Not difficult at all Somewhat difficult Not difficult at all         Assessment & Plan:   Joshua Kaiser was seen today for follow-up.  Diagnoses and all orders for this visit:  Medication management -     CBC with Differential/Platelet -     COMPLETE METABOLIC PANEL WITH GFR -     Lipid Panel w/reflex Direct LDL -     TSH  Acquired hypothyroidism -     TSH -     levothyroxine (SYNTHROID) 150 MCG tablet; 1 tablet daily, 1.5 tablets on Saturday  Screening  for lipid disorders -     Lipid Panel w/reflex Direct LDL  Screening for diabetes mellitus -     COMPLETE METABOLIC PANEL WITH GFR  Anxiety -     ALPRAZolam (XANAX) 1 MG tablet; TAKE ONE TABLET BY MOUTH UP TO TWICE A DAY AS NEEDED FOR ANXIETY.  Attention deficit hyperactivity disorder (ADHD), combined type -     amphetamine-dextroamphetamine (ADDERALL XR) 5 MG 24 hr capsule; Take 1 capsule (5 mg total) by mouth daily. -     amphetamine-dextroamphetamine (ADDERALL XR) 5 MG 24 hr capsule; Take 1 capsule (5 mg total) by mouth daily. -     amphetamine-dextroamphetamine (ADDERALL XR) 5 MG 24 hr capsule; Take 1 capsule (5 mg total) by mouth daily.  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   Pt is doing well. Refills sent.  Discussed plan to start tapering down some on Xanax due to its abuse and dependency potential.  Goal is to cut back over 6 months to 1/2 tablet(.5mg ) twice a day.  Discussed coping skills.  Joshua NeedleFridayPDMP reviewed during this encounter.  Needs labs.  Will adjust medications accordingly.  Follow up in 6 months.     Spent 30 minutes with patient reviewing chart and discussing plan to decrease xanax.

## 2021-04-04 LAB — CBC WITH DIFFERENTIAL/PLATELET
Absolute Monocytes: 598 cells/uL (ref 200–950)
Basophils Absolute: 41 cells/uL (ref 0–200)
Basophils Relative: 0.6 %
Eosinophils Absolute: 238 cells/uL (ref 15–500)
Eosinophils Relative: 3.5 %
HCT: 45.8 % (ref 38.5–50.0)
Hemoglobin: 15.2 g/dL (ref 13.2–17.1)
Lymphs Abs: 2421 cells/uL (ref 850–3900)
MCH: 28.7 pg (ref 27.0–33.0)
MCHC: 33.2 g/dL (ref 32.0–36.0)
MCV: 86.6 fL (ref 80.0–100.0)
MPV: 10 fL (ref 7.5–12.5)
Monocytes Relative: 8.8 %
Neutro Abs: 3502 cells/uL (ref 1500–7800)
Neutrophils Relative %: 51.5 %
Platelets: 325 10*3/uL (ref 140–400)
RBC: 5.29 10*6/uL (ref 4.20–5.80)
RDW: 13 % (ref 11.0–15.0)
Total Lymphocyte: 35.6 %
WBC: 6.8 10*3/uL (ref 3.8–10.8)

## 2021-04-04 LAB — COMPLETE METABOLIC PANEL WITH GFR
AG Ratio: 2 (calc) (ref 1.0–2.5)
ALT: 14 U/L (ref 9–46)
AST: 13 U/L (ref 10–40)
Albumin: 4.9 g/dL (ref 3.6–5.1)
Alkaline phosphatase (APISO): 41 U/L (ref 36–130)
BUN: 12 mg/dL (ref 7–25)
CO2: 28 mmol/L (ref 20–32)
Calcium: 9.9 mg/dL (ref 8.6–10.3)
Chloride: 103 mmol/L (ref 98–110)
Creat: 0.8 mg/dL (ref 0.60–1.35)
GFR, Est African American: 130 mL/min/{1.73_m2} (ref >=60)
GFR, Est Non African American: 112 mL/min/{1.73_m2} (ref >=60)
Globulin: 2.4 g/dL (calc) (ref 1.9–3.7)
Glucose, Bld: 91 mg/dL (ref 65–139)
Potassium: 4.8 mmol/L (ref 3.5–5.3)
Sodium: 139 mmol/L (ref 135–146)
Total Bilirubin: 1.8 mg/dL — ABNORMAL HIGH (ref 0.2–1.2)
Total Protein: 7.3 g/dL (ref 6.1–8.1)

## 2021-04-04 LAB — LIPID PANEL W/REFLEX DIRECT LDL
Cholesterol: 134 mg/dL (ref <200)
HDL: 55 mg/dL (ref >=40)
LDL Cholesterol (Calc): 64 mg/dL (calc)
Non-HDL Cholesterol (Calc): 79 mg/dL (calc) (ref <130)
Total CHOL/HDL Ratio: 2.4 (calc) (ref <5.0)
Triglycerides: 73 mg/dL (ref <150)

## 2021-04-04 LAB — TSH: TSH: 0.04 mIU/L — ABNORMAL LOW (ref 0.40–4.50)

## 2021-04-08 ENCOUNTER — Other Ambulatory Visit: Payer: Self-pay | Admitting: Neurology

## 2021-04-08 DIAGNOSIS — E039 Hypothyroidism, unspecified: Secondary | ICD-10-CM

## 2021-04-08 NOTE — Progress Notes (Signed)
Joshua Kaiser,   Hemoglobin looks good.  Kidney, liver, glucose looks good.  Bilirubin up some but this has occurred in the past and not too worrisome.  Cholesterol looks great.  TSH is a suppressed meaning too much active thyroid hormone. We need to decrease some and see if you feel a little better. Confirm the exact dosing of your levothyroxine then will make med adjustment. Recheck labs in 6 weeks. Too much thyroid can give you hyperthyroid symptoms.

## 2021-04-09 ENCOUNTER — Encounter: Payer: Self-pay | Admitting: Physician Assistant

## 2021-04-11 MED ORDER — LEVOTHYROXINE SODIUM 125 MCG PO TABS: 125.0000 ug | ORAL_TABLET | Freq: Every day | ORAL | 1 refills | Status: DC

## 2021-04-11 NOTE — Addendum Note (Signed)
Addended by: Chalmers Cater on: 04/11/2021 02:20 PM   Modules accepted: Orders

## 2021-07-20 IMAGING — CT CT ABD-PELV W/O CM
2 of 4 series · 16 of 46 positions shown, 18 images · non-contrast
Comparison: None.

CLINICAL DATA: Left groin pain for 2 months.

EXAM:
CT ABDOMEN AND PELVIS WITHOUT CONTRAST
TECHNIQUE: Multidetector CT imaging of the abdomen and pelvis was performed
following the standard protocol without IV contrast.

[Series 2: axial st · axial · 0.95mm/px · z∈[-605,-65]mm · 13 of 118 slices shown, 15 images]
[im 5/118  soft-tissue]
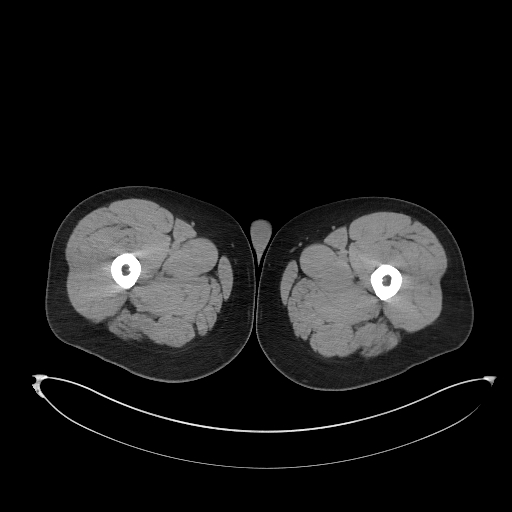
[im 5/118  bone]
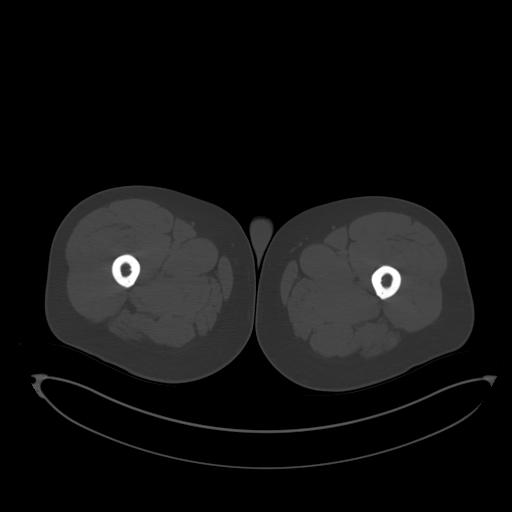
[im 14/118  soft-tissue]
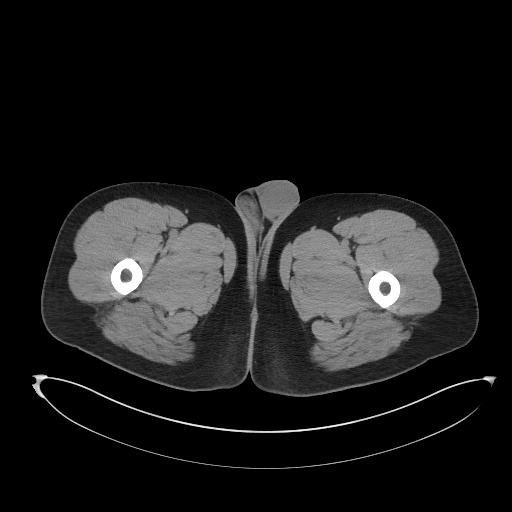
[im 23/118  soft-tissue]
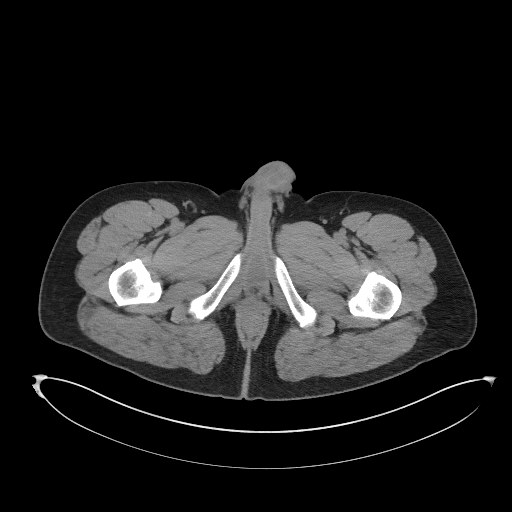
[im 32/118  soft-tissue]
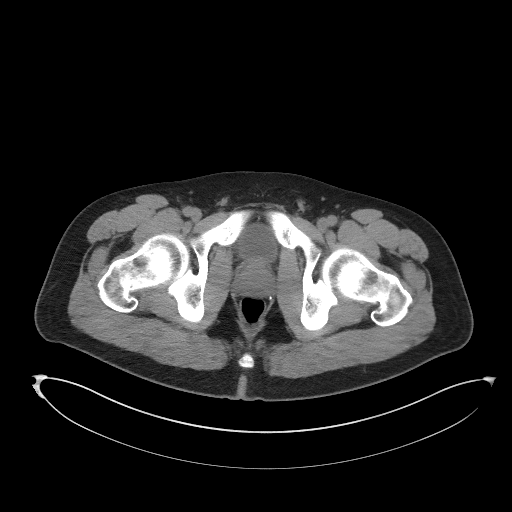
[im 41/118  soft-tissue]
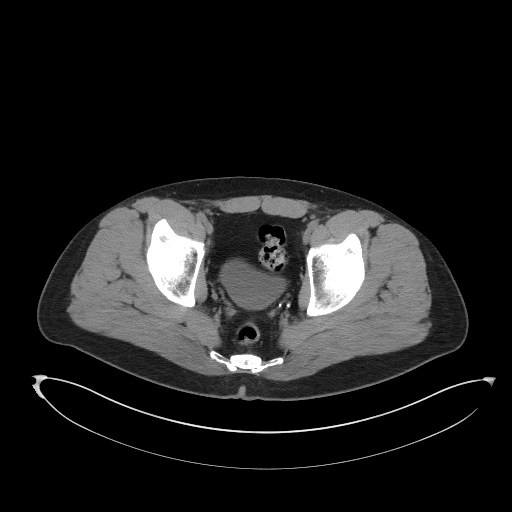
[im 50/118  soft-tissue]
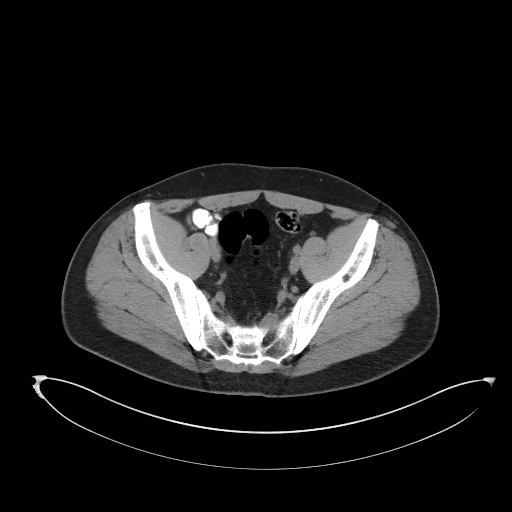
[im 59/118  soft-tissue]
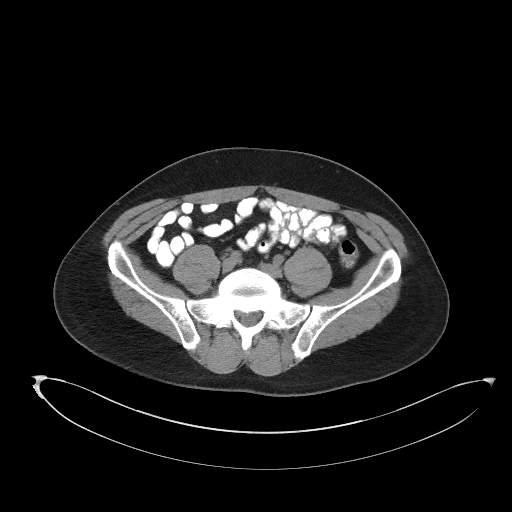
[im 68/118  soft-tissue]
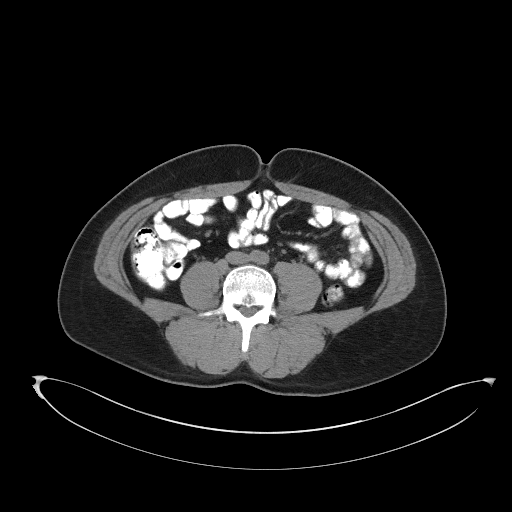
[im 77/118  soft-tissue]
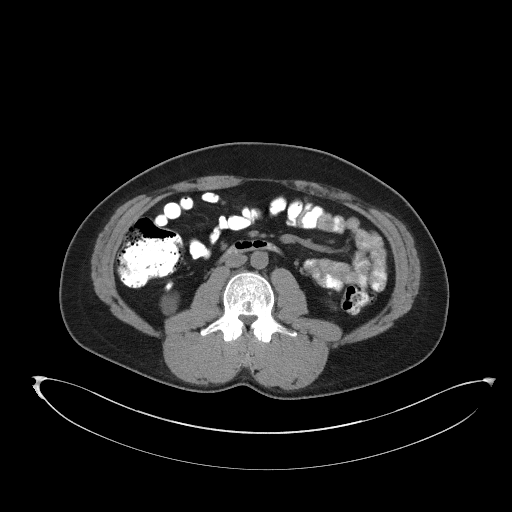
[im 77/118  bone]
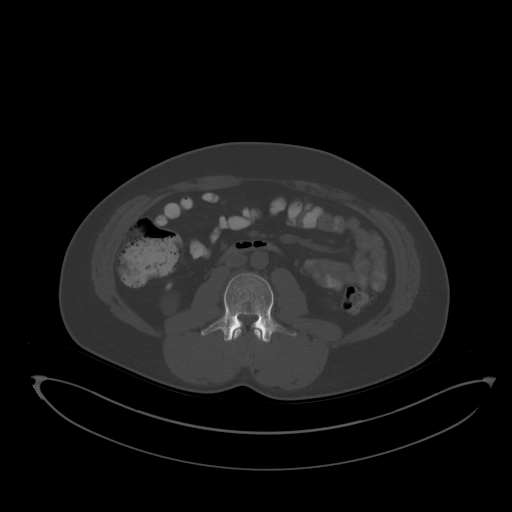
[im 86/118  soft-tissue]
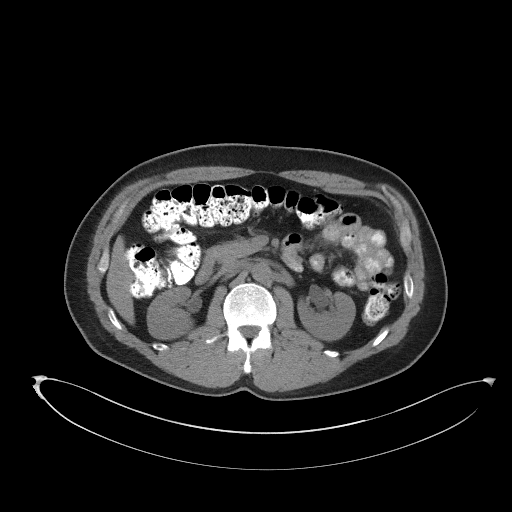
[im 95/118  soft-tissue]
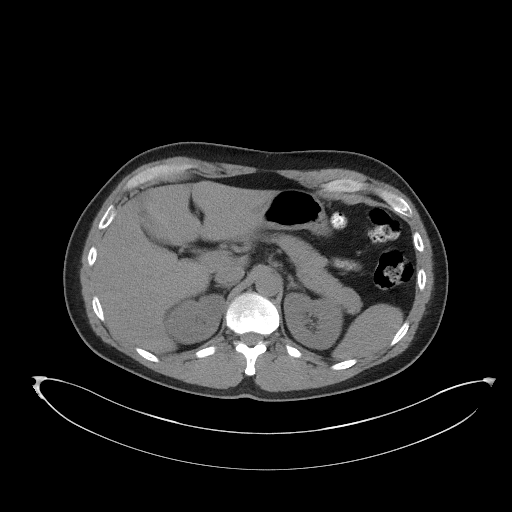
[im 104/118  soft-tissue]
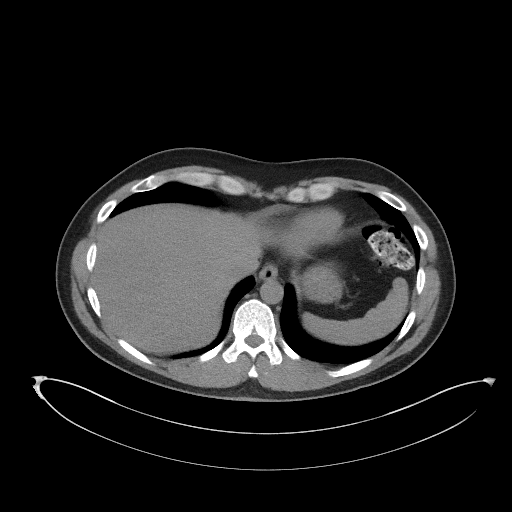
[im 113/118  soft-tissue]
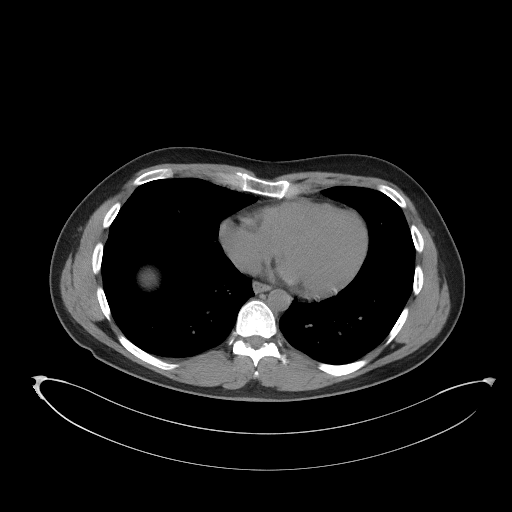

[Series 5: coronal st · coronal · 0.87mm/px · 3 of 82 slices shown]
[im 28/82  soft-tissue]
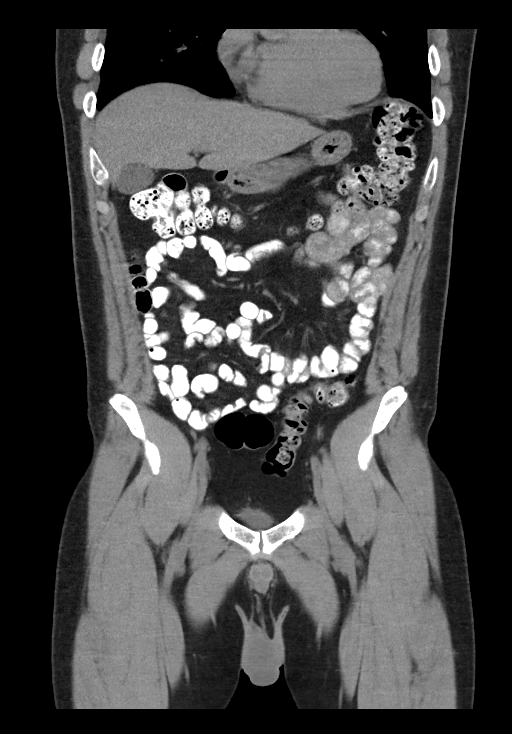
[im 37/82  soft-tissue]
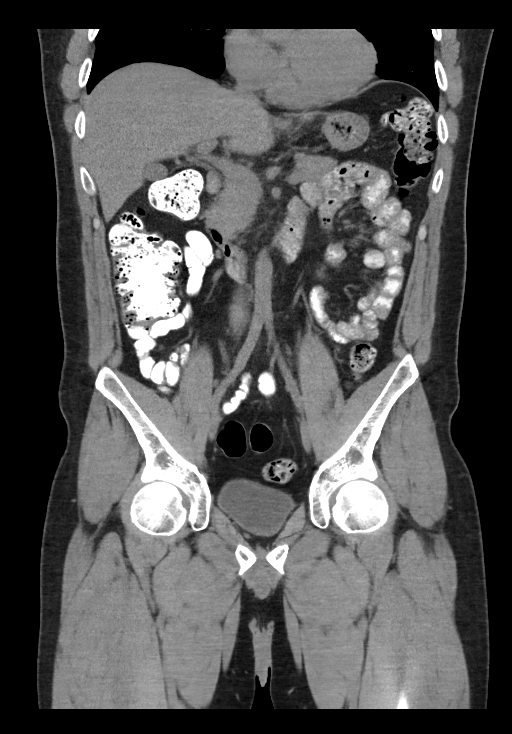
[im 46/82  soft-tissue]
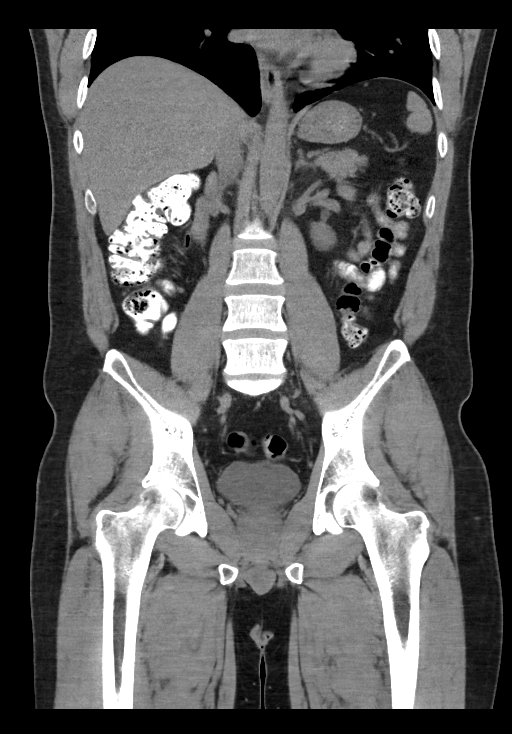

[16 of 46 positions shown; findings below may reference images not displayed]

FINDINGS: Lower chest: No acute abnormality.

Hepatobiliary: No focal liver abnormality is seen. No gallstones,
gallbladder wall thickening, or biliary dilatation.

Pancreas: Unremarkable. No pancreatic ductal dilatation or
surrounding inflammatory changes.

Spleen: Normal in size without focal abnormality.

Adrenals/Urinary Tract: Normal adrenal glands. No kidney stones
identified bilaterally. Cyst within inferior pole of right kidney
measures 2.4 cm. Incompletely characterized without IV contrast. No
hydronephrosis or hydroureter. No ureteral calculi or bladder
calculi.

Stomach/Bowel: Stomach is within normal limits. Appendix appears
normal. No evidence of bowel wall thickening, distention, or
inflammatory changes.

Vascular/Lymphatic: No significant vascular findings are present. No
enlarged abdominal or pelvic lymph nodes.

Reproductive: Prostate is unremarkable.

Other: No inguinal hernia identified. No free fluid or fluid
collections.

Musculoskeletal: There is mild degenerative disc disease at L5-S1.
IMPRESSION: 1. No acute findings within the abdomen or pelvis. No explanation
for patient's left groin pain.

## 2021-08-06 ENCOUNTER — Other Ambulatory Visit: Payer: Self-pay | Admitting: Physician Assistant

## 2021-09-16 ENCOUNTER — Encounter: Payer: Self-pay | Admitting: Physician Assistant

## 2021-09-17 MED ORDER — LEVOTHYROXINE SODIUM 125 MCG PO TABS
125.0000 ug | ORAL_TABLET | Freq: Every day | ORAL | 0 refills | Status: DC
Start: 2021-09-17 — End: 2021-09-29

## 2021-09-22 ENCOUNTER — Encounter: Payer: Self-pay | Admitting: Physician Assistant

## 2021-09-26 ENCOUNTER — Ambulatory Visit (INDEPENDENT_AMBULATORY_CARE_PROVIDER_SITE_OTHER): Payer: 59 | Admitting: Physician Assistant

## 2021-09-26 ENCOUNTER — Other Ambulatory Visit: Payer: Self-pay

## 2021-09-26 ENCOUNTER — Encounter: Payer: Self-pay | Admitting: Physician Assistant

## 2021-09-26 VITALS — BP 109/82 | HR 93 | Temp 98.0°F | Wt 175.0 lb

## 2021-09-26 DIAGNOSIS — F3181 Bipolar II disorder: Secondary | ICD-10-CM | POA: Diagnosis not present

## 2021-09-26 DIAGNOSIS — Z818 Family history of other mental and behavioral disorders: Secondary | ICD-10-CM | POA: Diagnosis not present

## 2021-09-26 DIAGNOSIS — R45851 Suicidal ideations: Secondary | ICD-10-CM | POA: Diagnosis not present

## 2021-09-26 NOTE — Patient Instructions (Addendum)
Lamictal Seroquel(most common)  Abilify Symbyax(prozac with olanzapine)  (988)    Suicidal Feelings: How to Help Yourself Suicide is when you end your own life. Suicidal ideation includes expressing thoughts about, or a preoccupation with, ending your own life. There are many things you can do to help yourself feel better when struggling with these feelings. Many services and people are available to support you and others who struggle with similar feelings. If you ever feel like you may hurt yourself or others, or have thoughts about taking your own life, get help right away. To get help: Go to your nearest emergency department. Call your local emergency services (911 in the [place] Call the [facility] health and human services helpline (211 in the [place] Call or text a suicide hotline to speak with a trained counselor. The following suicide hotlines are available in the [place]: 1-800-273-TALK (1-956-016-3982 or 988 in the [place] 1-800-SUICIDE 530-092-4210). Text 229-794-2680. This is the [employer] in the [place] (302) 725-8730. This is a hotline for Spanish speakers. (480) 330-3720. This is a hotline for TTY users. 1-866-4-U-TREVOR 952-038-4938). This is a hotline for lesbian, gay, bisexual, transgender, or questioning youth. For a list of hotlines in [place], visit AmenCredit.is.html Contact a crisis center or a local suicide prevention center. To find a crisis center or suicide prevention center: Call your local hospital, clinic, community service organization, mental health center, social service provider, or health department. Ask for help with connecting to a crisis center. For a list of crisis centers in the [place], visit: suicidepreventionlifeline.org For a list of crisis centers in [place], visit: suicideprevention.ca How to help yourself feel better  Promise yourself that you will not do anything bad or extreme  when you have suicidal feelings. Remember the times you have felt hopeful. Many people have gotten through suicidal thoughts and feelings, and you can too. If you have had these feelings before, remind yourself that you can get through them again. Let family, friends, teachers, or counselors know how you are feeling. Do not separate yourself from those who care about you and want to help you. Talk with someone every day, even if you do not feel like talking to anyone or being with other people. Face-to-face conversation is best to help them understand your feelings. Contact a mental health care provider and work with this person regularly. Make a safety plan that you can follow during a crisis. Include phone numbers of suicide prevention hotlines, mental health professionals, and trusted friends and family members you can call during an emergency. Save these numbers on your phone. If you are thinking of taking a lot of medicine, give your medicine to someone who can give it to you as prescribed. If you are on antidepressants and are concerned you will overdose, tell your health care provider so that he or she can give you safer medicines. Try to stick to your routines and follow a schedule every day. Make self-care a priority. Make a list of realistic goals, and cross them off when you achieve them. Accomplishments can give you a sense of worth. Wait until you are feeling better before doing things that you find difficult or unpleasant. Do things that you have always enjoyed to take your mind off your feelings. Try reading a book, or listening to or playing music. Spending time outside, in nature, may help you feel better. Follow these instructions at home:  Visit your primary health care provider every year for a physical and a mental  health checkup. Take over-the-counter and prescription medicines only as told by your health care provider. Ask your health care provider about the possible side  effects of any medicines you are taking. Ask your health care provider about whether suicidal ideation is a possible side effect of any of your medicines. Learn about suicidal ideation and what increases the risk for the development of suicidal thoughts. Eat a well-balanced diet, and eat regular meals. Get plenty of rest. Exercise if you are able. Just 30 minutes of exercise each day can help you feel better. Keep your living space well lit. Do not use alcohol or drugs. Remove these substances from your home. General recommendations Remove weapons, poisons, knives, and other deadly items from your home. Work with a mental health care provider as needed. When you are feeling well, write yourself a letter with tips and support that you can read when you are not feeling well. Remember that life's difficulties can be sorted out with help. Conditions can be treated, and you can learn behaviors and ways of thinking that will help you. Work with your health care provider or counselor to learn ways of coping with your thoughts and feelings. Where to find more information National Suicide Prevention Lifeline: www.suicidepreventionlifeline.org Hopeline: www.hopeline.[place] for Suicide Prevention: PromotionalLoans.co.za [employer] (for lesbian, gay, bisexual, transgender, or questioning youth): www.thetrevorproject.[employer] of Mental Health: BallotBlog.nl Suicide Prevention Resources: BallotBlog.nl Contact a health care provider if: You feel as though you are a burden to others. You feel agitated, angry, vengeful, or have extreme mood swings. You have withdrawn from family and friends. You are frequently using drugs or alcohol. Get help right away if: You are talking about suicide or wishing to die. You start making plans for how to commit suicide. You feel that you have no reason to live. You start making  plans for putting your affairs in order, saying goodbye, or giving your possessions away. You feel guilt, shame, or unbearable pain, and it seems like there is no way out. You are engaging in risky behaviors that could lead to death. If you have any of these thoughts or symptoms, get help right away: Go to your nearest emergency department or crisis center. Call emergency services (911 in the [place] Call or text a suicide crisis helpline. Summary Suicide is when you take your own life. Suicidal feelings are thoughts about ending your own life. Promise yourself that you will not do anything bad or extreme when you have suicidal feelings. Let family, friends, teachers, or counselors know how you are feeling. Get help right away if you start making plans for how to commit suicide. This information is not intended to replace advice given to you by your health care provider. Make sure you discuss any questions you have with your health care provider. Document Revised: 05/22/2021 Document Reviewed: 03/06/2021 Elsevier Patient Education  2022 [place].    Bipolar II Disorder Bipolar II disorder is a mental health disorder in which a person has episodes of emotional highs (hypomania) and episodes of emotional lows (depression). The emotional highs are less extreme than the highs in bipolar I disorder, and they do not last as long. People with bipolar II disorder have had at least one hypomanic episode in their lives, which is usually followed by a depressive episode. Some people may have cycles of hypomanic and depressive episodes. Some people with bipolar II disorder may lead very normal lives between episodes. What are the causes? The cause of  this condition is not known. What increases the risk? The following factors may make you more likely to develop this condition: Having a family member with the disorder. Having an imbalance of certain chemicals in the brain  (neurotransmitters). Experiencing stress, such as from illness, divorce, financial problems, or a death. Having certain conditions that affect the brain or spinal cord (neurologic conditions). Having had a brain injury (trauma). What are the signs or symptoms? Symptoms of this condition include the following: Symptoms of hypomania Very high self-esteem or self-confidence. Less need for sleep. Unusual talkativeness. Speech may be very fast. Racing thoughts, with quick shifts between topics that may or may not be related (flight of ideas). Being more able or less able to concentrate than normal. Increased agitation. This could be pacing, squirming, fidgeting, or finger and toe tapping. Impulsive behavior and poor judgment. These may result in high-risk activities, such as: Being sexual with people you normally would not be sexual with. Spending money you have borrowed on things you do not need. Symptoms of depression Extreme sadness, uncontrollable crying, or feeling hopeless, worthless, or numb. Sleep problems, such as trouble falling asleep or staying asleep (insomnia), waking early, or sleeping too much. No longer enjoying things you used to enjoy. Isolation, or spending time alone often. Lack of energy or moving more slowly than normal. Trouble making decisions. Changes in appetite, such as eating too much or not eating. Thoughts of death, or wanting to harm yourself. Sometimes, you may have a mix of symptoms of hypomania and depression at the same time. Stress can often trigger these symptoms. How is this diagnosed? This condition may be diagnosed based on: A mental health evaluation that includes a review of your emotional episodes. Your medical history. Use of alcohol, drugs, and prescription medicines. Certain medical conditions and substances can cause secondary bipolar disorder. This has symptoms like the symptoms of bipolar disorder. Your health care provider may ask you to  take a short test to help understand your symptoms. You may also be asked to see a mental health specialist for further evaluation or to start treatment. How is this treated?   This condition is a long-term (chronic) illness. It is often managed with ongoing treatment rather than treatment only when symptoms occur. A combination of treatments is often used. Treatment may include: Talk therapy (psychotherapy) to help you manage bipolar disorder. Talk therapy includes cognitive behavioral therapy (CBT) and family therapy. Psychoeducation. This helps you and others understand how your disorder is managed. Include friends and family in educational sessions so they learn how best to support you. Methods of managing your condition, such as journaling or relaxation exercises. These include: Yoga. Meditation. Deep breathing. Lifestyle changes, such as: Avoiding alcohol and drug use. Exercising regularly. Setting a regular bedtime and wake time. Eating a healthy diet. Medicines, usually medicines called mood stabilizers. Medicines can be prescribed by health care providers who specialize in treating mental health disorders (psychiatrists). If symptoms occur during use of a mood stabilizer, other medicines may be added. Follow these instructions at home: Activity Return to your normal activities as told by your health care provider. Find activities that you enjoy, and make time to do them. Get regular exercise. Lifestyle  Follow a set daily schedule. Eat a healthy diet that includes fresh fruits and vegetables, whole grains, low-fat dairy, and lean meat. Get at least 7-8 hours of sleep each night. Avoid using products that contain nicotine or tobacco. If you want help quitting, ask your health  care provider. Avoid alcohol and drugs. They can affect how medicine works and make symptoms worse. General instructions Take over-the-counter and prescription medicines only as told by your health care  provider. You may think about stopping your medicine, but you need to take all your medicine as prescribed. This helps manage your symptoms. Consider joining a support group. Your health care provider may be able to recommend one. Talk with your family and friends about your treatment goals and how they can help. Keep all follow-up visits. This is important. Where to find more information [employer] on Mental Illness: nami.[place]: https://www.frey.org/ Contact a health care provider if: Your symptoms get worse, or your loved ones tell you that your symptoms are getting worse. You have uncomfortable side effects from your medicine. You have trouble sleeping. You have trouble doing daily activities. You feel unsafe in your surroundings. You are using alcohol or drugs to manage your symptoms. Get help right away if: You have new symptoms. You have thoughts about harming yourself or others. You are considering suicide. If you ever feel like you may hurt yourself or others, or have thoughts about taking your own life, get help right away. Go to your nearest emergency department or: Call your local emergency services (911 in the [place] Call a suicide crisis helpline, such as the [place] at 604-619-6671 or 988 in the [place]. This is open 24 hours a day in the [place]. Text the Crisis Text Line at (561)448-5877 (in the [place].). Summary Bipolar II disorder is a lifelong mental health disorder in which a person has episodes of hypomania and depression. This disorder is mainly treated with a combination of talk therapy, education, methods of managing the condition, and medicines. Talk with your family and friends about your treatment goals and how they can help. Get help right away if you are considering suicide. This information is not intended to replace advice given to you by your health care provider. Make sure you discuss any questions you have with  your health care provider. Document Revised: 05/22/2021 Document Reviewed: 04/17/2021 Elsevier Patient Education  [place].

## 2021-09-26 NOTE — Progress Notes (Signed)
   Subjective:    Patient ID: Joshua Kaiser, male    DOB: 29-Apr-1981, 40 y.o.   MRN: [id number]  HPI Pt is a 40 yo male with hx of mood disorder, anxiety, depression, panic attacks, PTSD who presents to the clinic after episode of severe depression and suicidal thoughts. He called 911 and was taken [facility]. It was found that he had missed some adderall dosages and caused depression symptoms. After a few days he was having thoughts of self harm but did not make a plan.  He has stayed off adderall since. He is feeling better. He has started talking to a counselor daily. He is interested in outpatient intensive therapy.   Pt has battled depression and anxiety for years. He has tried numerous medications but they have just not worked. He has been resistant to try bipolar medications. Denies mania but endorses hypomania symptoms and severe depression. His mother is bipolar and schizophrenic. He denies hearing voices or hallucination.  .. Active Ambulatory Problems    Diagnosis Date Noted   Anxiety 11/04/2012   Hypothyroidism 11/04/2012   Right knee meniscal tear 11/04/2012   Moderate obstructive sleep apnea 02/14/2013   Special screening examination for neoplasm of prostate 02/19/2015   Low energy 01/06/2017   Injury of left shoulder 01/19/2017   Moderate major depression (HCC) 07/11/2017   PTSD (post-traumatic stress disorder) 07/11/2017   Elevated bilirubin 04/18/2018   Testicular pain, left 04/14/2019   Recurrent major depressive disorder, in partial remission (HCC) 04/14/2019   H/O vasectomy 04/14/2019   Family history of bipolar disorder 08/01/2019   Unilateral groin pain, left 01/22/2020   Flank pain 01/22/2020   Inattention 01/22/2020   Chronic fatigue 01/22/2020   History of ADHD 01/22/2020   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     Objective:   Physical Exam No acute distress Normal mood. Good thought process, understanding and judgement.       Assessment & Plan:  Marland KitchenMarland KitchenEmeterio was seen today for follow-up.  Diagnoses and all orders for this visit:  Bipolar II disorder (HCC) -     Ambulatory referral to Psychology  Family history of bipolar disorder -     Ambulatory referral to Psychology  Suicidal thoughts -     Ambulatory referral to Psychology  Stop Adderall and do not take anymore.  We have tried looking back numerous SSRI, SSNRIs which have not worked. I think we need to try bipolar medications.   Suggested abilify/seroquel/symbyax/lamictal. Pt will do some thinking over the weekend and get back to me on what he is willing to try.  Needs counseling. Will refer.  Discussed what to do if has any more harmful thoughts.  Go to [facility] and/or call [phone].   Spent 40 minutes with patient discussing and reviewing chart as well as discussing plan and medications for the future.

## 2021-09-29 ENCOUNTER — Encounter: Payer: Self-pay | Admitting: Physician Assistant

## 2021-09-29 DIAGNOSIS — R45851 Suicidal ideations: Secondary | ICD-10-CM | POA: Insufficient documentation

## 2021-09-29 DIAGNOSIS — F3181 Bipolar II disorder: Secondary | ICD-10-CM | POA: Insufficient documentation

## 2021-09-29 DIAGNOSIS — S39012A Strain of muscle, fascia and tendon of lower back, initial encounter: Secondary | ICD-10-CM

## 2021-09-29 MED ORDER — DICLOFENAC SODIUM 75 MG PO TBEC: 75.0000 mg | DELAYED_RELEASE_TABLET | Freq: Two times a day (BID) | ORAL | 1 refills | Status: DC | PRN

## 2021-09-29 MED ORDER — LEVOTHYROXINE SODIUM 125 MCG PO TABS: 125.0000 ug | ORAL_TABLET | Freq: Every day | ORAL | 0 refills | Status: DC

## 2021-09-29 MED ORDER — QUETIAPINE FUMARATE 50 MG PO TABS: 50.0000 mg | ORAL_TABLET | Freq: Every day | ORAL | 1 refills | Status: DC

## 2021-09-30 ENCOUNTER — Other Ambulatory Visit: Payer: Self-pay | Admitting: Physician Assistant

## 2021-09-30 DIAGNOSIS — F419 Anxiety disorder, unspecified: Secondary | ICD-10-CM

## 2021-09-30 DIAGNOSIS — F3181 Bipolar II disorder: Secondary | ICD-10-CM

## 2021-09-30 DIAGNOSIS — R45851 Suicidal ideations: Secondary | ICD-10-CM

## 2021-09-30 DIAGNOSIS — F4310 Post-traumatic stress disorder, unspecified: Secondary | ICD-10-CM

## 2021-10-01 ENCOUNTER — Other Ambulatory Visit: Payer: Self-pay | Admitting: Physician Assistant

## 2021-10-01 DIAGNOSIS — F419 Anxiety disorder, unspecified: Secondary | ICD-10-CM

## 2021-10-26 ENCOUNTER — Other Ambulatory Visit: Payer: Self-pay | Admitting: Physician Assistant

## 2021-11-11 ENCOUNTER — Ambulatory Visit: Payer: 59 | Admitting: Physician Assistant

## 2021-11-13 ENCOUNTER — Telehealth (HOSPITAL_COMMUNITY): Payer: Self-pay | Admitting: Psychiatry

## 2021-12-31 ENCOUNTER — Encounter: Payer: Self-pay | Admitting: Physician Assistant

## 2021-12-31 ENCOUNTER — Ambulatory Visit: Payer: Managed Care, Other (non HMO) | Admitting: Physician Assistant

## 2021-12-31 ENCOUNTER — Other Ambulatory Visit: Payer: Self-pay

## 2021-12-31 VITALS — BP 124/74 | HR 82 | Ht 72.0 in | Wt 179.0 lb

## 2021-12-31 DIAGNOSIS — F419 Anxiety disorder, unspecified: Secondary | ICD-10-CM | POA: Diagnosis not present

## 2021-12-31 DIAGNOSIS — M25562 Pain in left knee: Secondary | ICD-10-CM

## 2021-12-31 DIAGNOSIS — F3181 Bipolar II disorder: Secondary | ICD-10-CM

## 2021-12-31 DIAGNOSIS — E039 Hypothyroidism, unspecified: Secondary | ICD-10-CM | POA: Diagnosis not present

## 2021-12-31 DIAGNOSIS — G8929 Other chronic pain: Secondary | ICD-10-CM

## 2021-12-31 MED ORDER — ALPRAZOLAM 1 MG PO TABS: ORAL_TABLET | ORAL | 5 refills | Status: DC

## 2021-12-31 MED ORDER — DICLOFENAC SODIUM 75 MG PO TBEC: 75.0000 mg | DELAYED_RELEASE_TABLET | Freq: Two times a day (BID) | ORAL | 1 refills | Status: AC | PRN

## 2021-12-31 NOTE — Patient Instructions (Signed)
Jon Acuff "soundtracks", "Finish".

## 2021-12-31 NOTE — Progress Notes (Signed)
Subjective:    Patient ID: Joshua Kaiser, male    DOB: 05-05-1981, 41 y.o.   MRN: [id number]  HPI Pt is a 41 yo male who presents to the clinic for medication follow up.   Pt is doing really well today. Seroquel made him very sleepy and he stopped it. He is not exercising and going to counseling. No SI/HC. He is listening to head space and calm apps. He is doing well at home and at work. He feels much better.   .. Active Ambulatory Problems    Diagnosis Date Noted   Anxiety 11/04/2012   Hypothyroidism 11/04/2012   Right knee meniscal tear 11/04/2012   Moderate obstructive sleep apnea 02/14/2013   Special screening examination for neoplasm of prostate 02/19/2015   Low energy 01/06/2017   Injury of left shoulder 01/19/2017   Moderate major depression (HCC) 07/11/2017   PTSD (post-traumatic stress disorder) 07/11/2017   Elevated bilirubin 04/18/2018   Testicular pain, left 04/14/2019   Recurrent major depressive disorder, in partial remission (HCC) 04/14/2019   H/O vasectomy 04/14/2019   Family history of bipolar disorder 08/01/2019   Unilateral groin pain, left 01/22/2020   Flank pain 01/22/2020   Inattention 01/22/2020   Chronic fatigue 01/22/2020   History of ADHD 01/22/2020   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 Depression screen [facility] 2/9 12/31/2021 09/26/2021 03/28/2021 09/27/2020 04/05/2020  Decreased Interest 0 3 0 0 1  Down, Depressed, Hopeless 0 3 0 0 0  PHQ - 2 Score 0 6 0 0 1   Altered sleeping - 2 - 0 0  Tired, decreased energy - 2 - 0 1  Change in appetite - 2 - 1 2  Feeling bad or failure about yourself  - 2 - 1 0  Trouble concentrating - 0 - 0 0  Moving slowly or fidgety/restless - 0 - 0 0  Suicidal thoughts - 1 - 0 0  PHQ-9 Score - 15 - 2 4  Difficult doing work/chores - Very difficult - Not difficult at all Not difficult at all  Some recent data might be hidden   .Marland Kitchen GAD 7 : Generalized Anxiety Score 12/31/2021 09/26/2021 09/27/2020 04/05/2020  Nervous, Anxious, on Edge 1 2 1 1   Control/stop worrying 1 2 1  0  Worry too much - different things 1 2 1 1   Trouble relaxing 0 2 0 0  Restless 0 2 0 0  Easily annoyed or irritable 0 2 0 0  Afraid - awful might happen 0 2 0 0  Total GAD 7 Score 3 14 3 2   Anxiety Difficulty Not difficult at all Very difficult Not difficult at all Not difficult at all        Assessment & Plan:   Joevon was seen today for follow-up.  Diagnoses and all orders for this visit:  Hypothyroidism, unspecified type -     TSH  Anxiety -     ALPRAZolam (XANAX) 1 MG tablet; TAKE ONE TABLET BY MOUTH UP TO TWICE A DAY AS NEEDED FOR  ANXIETY.  Chronic pain of left knee -     diclofenac (VOLTAREN) 75 MG EC tablet; Take 1 tablet (75 mg total) by mouth 2 (two) times daily as needed for mild pain (take with meals).  Bipolar II disorder (HCC)   PHQ/GAD much better Continue exercise/counseling/self care Xanax refilled Voltaren refilled for prn for knee pain Needs TSH checked for medication adjustment

## 2022-01-14 ENCOUNTER — Other Ambulatory Visit: Payer: Self-pay | Admitting: Physician Assistant

## 2022-01-14 LAB — TSH: TSH: 0.4 mIU/L (ref 0.40–4.50)

## 2022-01-14 MED ORDER — LEVOTHYROXINE SODIUM 125 MCG PO TABS: 125.0000 ug | ORAL_TABLET | Freq: Every day | ORAL | 1 refills | Status: DC

## 2022-01-14 NOTE — Progress Notes (Signed)
TSH in normal range sent medication for 6 months to pharmacy.

## 2022-03-30 ENCOUNTER — Encounter: Payer: Self-pay | Admitting: Physician Assistant

## 2022-05-22 ENCOUNTER — Encounter: Payer: Self-pay | Admitting: Physician Assistant

## 2022-05-22 DIAGNOSIS — F419 Anxiety disorder, unspecified: Secondary | ICD-10-CM

## 2022-05-25 MED ORDER — BUPROPION HCL ER (XL) 150 MG PO TB24: 150.0000 mg | ORAL_TABLET | Freq: Every day | ORAL | 1 refills | Status: DC

## 2022-06-22 ENCOUNTER — Other Ambulatory Visit: Payer: Self-pay | Admitting: Physician Assistant

## 2022-06-22 DIAGNOSIS — F419 Anxiety disorder, unspecified: Secondary | ICD-10-CM

## 2022-06-30 ENCOUNTER — Ambulatory Visit: Payer: Managed Care, Other (non HMO) | Admitting: Physician Assistant

## 2022-07-06 ENCOUNTER — Telehealth (INDEPENDENT_AMBULATORY_CARE_PROVIDER_SITE_OTHER): Payer: 59 | Admitting: Physician Assistant

## 2022-07-06 ENCOUNTER — Encounter: Payer: Self-pay | Admitting: Physician Assistant

## 2022-07-06 VITALS — Ht 72.0 in | Wt 182.0 lb

## 2022-07-06 DIAGNOSIS — E039 Hypothyroidism, unspecified: Secondary | ICD-10-CM | POA: Diagnosis not present

## 2022-07-06 DIAGNOSIS — F3341 Major depressive disorder, recurrent, in partial remission: Secondary | ICD-10-CM

## 2022-07-06 DIAGNOSIS — F419 Anxiety disorder, unspecified: Secondary | ICD-10-CM

## 2022-07-06 DIAGNOSIS — F4310 Post-traumatic stress disorder, unspecified: Secondary | ICD-10-CM | POA: Diagnosis not present

## 2022-07-06 MED ORDER — ALPRAZOLAM 1 MG PO TABS: ORAL_TABLET | ORAL | 1 refills | Status: DC

## 2022-07-06 MED ORDER — BUPROPION HCL ER (XL) 150 MG PO TB24: 150.0000 mg | ORAL_TABLET | Freq: Every day | ORAL | 0 refills | Status: DC

## 2022-07-06 NOTE — Progress Notes (Signed)
..Virtual Visit via Video Note  I connected with Joshua Kaiser on 07/06/22 at 11:10 AM EDT by a video enabled telemedicine application and verified that I am speaking with the correct person using two identifiers.  Location: Patient: home Provider: clinic  .Joshua KitchenParticipating in visit:  Patient: Joshua Kaiser Provider: Tandy Gaw PA-C   I discussed the limitations of evaluation and management by telemedicine and the availability of in person appointments. The patient expressed understanding and agreed to proceed.  History of Present Illness: Pt is a 41 yo male with anxiety, PTSD, MDD who needs refills on medication. He is doing so much better on wellbutrin. His depression and anxiety have improved. He notice immediate benefit but feels like some of the benefit has worn off some. He needs refills. No side effects.   .. Active Ambulatory Problems    Diagnosis Date Noted   Anxiety 11/04/2012   Hypothyroidism 11/04/2012   Right knee meniscal tear 11/04/2012   Moderate obstructive sleep apnea 02/14/2013   Special screening examination for neoplasm of prostate 02/19/2015   Low energy 01/06/2017   Injury of left shoulder 01/19/2017   Moderate major depression (HCC) 07/11/2017   PTSD (post-traumatic stress disorder) 07/11/2017   Elevated bilirubin 04/18/2018   Testicular pain, left 04/14/2019   Recurrent major depressive disorder, in partial remission (HCC) 04/14/2019   H/O vasectomy 04/14/2019   Family history of bipolar disorder 08/01/2019   Unilateral groin pain, left 01/22/2020   Flank pain 01/22/2020   Inattention 01/22/2020   Chronic fatigue 01/22/2020   History of ADHD 01/22/2020   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  ..    07/06/2022   11:04 AM 12/31/2021    7:14 AM 09/26/2021    8:51 AM 03/28/2021    8:16 AM 09/27/2020    9:07 AM  Depression screen PHQ 2/9  Decreased Interest 0 0 3 0 0  Down, Depressed, Hopeless 0 0 3 0 0  PHQ - 2 Score 0 0 6 0 0  Altered sleeping 0  2  0  Tired, decreased energy 1  2  0  Change in appetite 0  2  1  Feeling bad or failure about yourself  0  2  1  Trouble concentrating 1  0  0  Moving slowly or fidgety/restless 0  0  0  Suicidal thoughts 0  1  0  PHQ-9 Score 2  15  2   Difficult doing work/chores Not difficult at all  Very difficult  Not difficult at all   ..    07/06/2022   11:05 AM 12/31/2021    7:36 AM 09/26/2021    8:52 AM 09/27/2020    9:07 AM  GAD 7 : Generalized Anxiety Score  Nervous, Anxious, on Edge 0 1 2 1   Control/stop worrying 1 1 2 1   Worry too much - different things 1 1 2  1  Trouble relaxing 0 0 2 0  Restless 0 0 2 0  Easily annoyed or irritable 0 0 2 0  Afraid - awful might happen 0 0 2 0  Total GAD 7 Score 2 3 14 3   Anxiety Difficulty Not difficult at all Not difficult at all Very difficult Not difficult at all      Assessment and Plan: Joshua KitchenRigdon was seen today for follow-up.  Diagnoses and all orders for this visit:  PTSD (post-traumatic stress disorder)  Anxiety -     ALPRAZolam (XANAX) 1 MG tablet; TAKE ONE TABLET BY MOUTH UP TO TWICE A DAY AS NEEDED FOR ANXIETY. -     buPROPion (WELLBUTRIN XL) 150 MG 24 hr tablet; Take 1 tablet (150 mg total) by mouth daily.  Hypothyroidism, unspecified type -     TSH  Recurrent major depressive disorder, in  partial remission (HCC)   Pt is doing much better with mood. At times he wonders if increase of wellbutrin would help He would like to consider taper off xanax in near future Follow up in 6 months    Follow Up Instructions:    I discussed the assessment and treatment plan with the patient. The patient was provided an opportunity to ask questions and all were answered. The patient agreed with the plan and demonstrated an understanding of the instructions.   The patient was advised to call back or seek an in-person evaluation if the symptoms worsen or if the condition fails to improve as anticipated.  Casimiro Needle, PA-C

## 2022-07-06 NOTE — Progress Notes (Signed)
PHQ9 (2) -GAD7 (2)  completed.   Patient sees a big difference with addition of Wellbutrin

## 2022-07-22 ENCOUNTER — Encounter: Payer: Self-pay | Admitting: Physician Assistant

## 2022-07-22 DIAGNOSIS — N50812 Left testicular pain: Secondary | ICD-10-CM

## 2022-07-22 DIAGNOSIS — N5082 Scrotal pain: Secondary | ICD-10-CM

## 2022-07-22 DIAGNOSIS — R1032 Left lower quadrant pain: Secondary | ICD-10-CM

## 2022-07-28 ENCOUNTER — Ambulatory Visit (INDEPENDENT_AMBULATORY_CARE_PROVIDER_SITE_OTHER): Payer: 59 | Admitting: Physician Assistant

## 2022-07-28 ENCOUNTER — Encounter: Payer: Self-pay | Admitting: Physician Assistant

## 2022-07-28 VITALS — BP 129/89 | HR 72 | Ht 72.0 in | Wt 186.0 lb

## 2022-07-28 DIAGNOSIS — N5082 Scrotal pain: Secondary | ICD-10-CM

## 2022-07-28 DIAGNOSIS — R1032 Left lower quadrant pain: Secondary | ICD-10-CM | POA: Diagnosis not present

## 2022-07-28 DIAGNOSIS — N50812 Left testicular pain: Secondary | ICD-10-CM | POA: Diagnosis not present

## 2022-07-28 LAB — POCT URINALYSIS DIP (CLINITEK)
Bilirubin, UA: NEGATIVE
Blood, UA: NEGATIVE
Glucose, UA: NEGATIVE mg/dL
Ketones, POC UA: NEGATIVE mg/dL
Leukocytes, UA: NEGATIVE
Nitrite, UA: NEGATIVE
POC PROTEIN,UA: NEGATIVE
Spec Grav, UA: 1.025 (ref 1.010–1.025)
Urobilinogen, UA: 0.2 E.U./dL
pH, UA: 6 (ref 5.0–8.0)

## 2022-07-28 NOTE — Patient Instructions (Signed)
Will order scrotal ultrasound and CT abdomin and pelvis.

## 2022-07-28 NOTE — Progress Notes (Signed)
Established Patient Office Visit  Subjective   Patient ID: Joshua Kaiser, male    DOB: 27-Feb-1981  Age: 41 y.o. MRN: [id number]  Chief Complaint  Patient presents with   Groin Pain    HPI Pt is a 41 yo male who presents to the clinic to discuss ongoing persisent left testicle and left groin pain. He had a scrotal ultrasound back in 2020 and then CT in 03/2020 with no explanation to pain. He has seen 2 urologist that told him to take NSAIDs and ice. He denies any swelling. This all started occurring after his vascetomy and only on the left side. Pain is worse when active with exertion/exercise or after intercourse. No problems with urination. No fever, chills, body aches. In the last 2 months pain seems to be radiating into the left groin area and this is concerning.     IMPRESSION: 1. Normal sonographic appearance of both testicles. No cystic or mass lesions and patent intratesticular blood flow. 2. Mildly enlarged epididymi bilaterally with findings consistent with tubular ectasia. This is most typically seen post vasectomy. 3. Small epididymal cyst involving the right epididymal head. 4. No scrotal lesions.   IMPRESSION after CT:  1. No acute findings within the abdomen or pelvis. No explanation for patient's left groin pain.   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 01/22/2020   Obsessional thoughts 01/22/2020   Family history of bipolar disorder 08/01/2019   Testicular pain, left 04/14/2019   Recurrent major depressive  disorder, in partial remission (HCC) 04/14/2019   H/O vasectomy 04/14/2019   Elevated bilirubin 04/18/2018   Moderate major depression (HCC) 07/11/2017   PTSD (post-traumatic stress disorder) 07/11/2017   Injury of left shoulder 01/19/2017   Low energy 01/06/2017   Special screening examination for neoplasm of prostate 02/19/2015   Moderate obstructive sleep apnea 02/14/2013   Anxiety 11/04/2012   Hypothyroidism 11/04/2012   Right knee meniscal tear 11/04/2012   Past Medical History:  Diagnosis Date   Anxiety 11/04/2012   Hypothyroidism 11/04/2012   Right knee meniscal tear 11/04/2012   Right knee meniscal tear 11/04/2012   Family History  Problem Relation Age of Onset   Hypertension Father    Depression Mother    Allergies  Allergen Reactions   Adderall [Amphetamine-Dextroamphetamine]     Suicidal thoughts when he would miss doses as well as deep depression   Effexor Xr [Venlafaxine Hcl Er]     Felt weird    Naproxen Other (See Comments)    "messes with stomach"   Seroquel [Quetiapine]     fatigue   Tramadol     Loopy feeling   Viibryd [Vilazodone Hcl]     Manic episode/mood swing      ROS See HPI.    Objective:     BP 129/89   Pulse 72   Ht 6' (1.829 m)   Wt 186 lb (84.4 kg)   SpO2 98%   BMI 25.23 kg/m  BP Readings from Last 3 Encounters:  07/28/22 129/89  12/31/21 124/74  09/26/21 109/82   Wt Readings from Last 3  Encounters:  07/28/22 186 lb (84.4 kg)  07/06/22 182 lb (82.6 kg)  12/31/21 179 lb (81.2 kg)  .Marland Kitchen Results for orders placed or performed in visit on 07/28/22  POCT URINALYSIS DIP (CLINITEK)  Result Value Ref Range   Color, UA yellow yellow   Clarity, UA clear clear   Glucose, UA negative negative mg/dL   Bilirubin, UA negative negative   Ketones, POC UA negative negative mg/dL   Spec Grav, UA 1.025 1.010 - 1.025   Blood, UA negative negative   pH, UA 6.0 5.0 - 8.0   POC PROTEIN,UA negative negative, trace   Urobilinogen, UA 0.2  0.2 or 1.0 E.U./dL   Nitrite, UA Negative Negative   Leukocytes, UA Negative Negative       Physical Exam Constitutional:      Appearance: Normal appearance.  HENT:     Head: Normocephalic.  Cardiovascular:     Rate and Rhythm: Normal rate.  Pulmonary:     Effort: Pulmonary effort is normal.  Abdominal:     General: There is no distension.     Palpations: There is no mass.     Tenderness: There is no abdominal tenderness. There is no right CVA tenderness, left CVA tenderness, guarding or rebound.     Hernia: No hernia is present.  Neurological:     General: No focal deficit present.     Mental Status: He is alert and oriented to person, place, and time.  Psychiatric:        Mood and Affect: Mood normal.        Assessment & Plan:  Marland KitchenMarland KitchenKishan was seen today for groin pain.  Diagnoses and all orders for this visit:  Left testicular pain -     PSA, total and free -     CBC w/Diff/Platelet -     COMPLETE METABOLIC PANEL WITH GFR -     POCT URINALYSIS DIP (CLINITEK)  Left groin pain -     PSA, total and free -     CBC w/Diff/Platelet -     COMPLETE METABOLIC PANEL WITH GFR -     POCT URINALYSIS DIP (CLINITEK) -     MR PELVIS WO CONTRAST; Future  Pain in scrotum -     MR PELVIS WO CONTRAST; Future   Unclear etiology of symptoms.  UA is negative Will check PSA/CBC/CMP No answers with CT or U/S Ordered MRI.  Will refer back to urology pending MRI results  Iran Planas, PA-C

## 2022-07-29 ENCOUNTER — Ambulatory Visit: Payer: 59 | Admitting: Physician Assistant

## 2022-07-29 NOTE — Progress Notes (Signed)
Normal hemoglobin.  Normal WBC.  Liver, kidney, glucose looks good.

## 2022-07-30 LAB — CBC WITH DIFFERENTIAL/PLATELET
Absolute Monocytes: 825 cells/uL (ref 200–950)
Basophils Absolute: 30 cells/uL (ref 0–200)
Basophils Relative: 0.4 %
Eosinophils Absolute: 233 cells/uL (ref 15–500)
Eosinophils Relative: 3.1 %
HCT: 47.8 % (ref 38.5–50.0)
Hemoglobin: 16.2 g/dL (ref 13.2–17.1)
Lymphs Abs: 2310 cells/uL (ref 850–3900)
MCH: 29.5 pg (ref 27.0–33.0)
MCHC: 33.9 g/dL (ref 32.0–36.0)
MCV: 87.1 fL (ref 80.0–100.0)
MPV: 10.1 fL (ref 7.5–12.5)
Monocytes Relative: 11 %
Neutro Abs: 4103 cells/uL (ref 1500–7800)
Neutrophils Relative %: 54.7 %
Platelets: 323 10*3/uL (ref 140–400)
RBC: 5.49 10*6/uL (ref 4.20–5.80)
RDW: 12.8 % (ref 11.0–15.0)
Total Lymphocyte: 30.8 %
WBC: 7.5 10*3/uL (ref 3.8–10.8)

## 2022-07-30 LAB — COMPLETE METABOLIC PANEL WITH GFR
AG Ratio: 1.8 (calc) (ref 1.0–2.5)
ALT: 25 U/L (ref 9–46)
AST: 18 U/L (ref 10–40)
Albumin: 5 g/dL (ref 3.6–5.1)
Alkaline phosphatase (APISO): 36 U/L (ref 36–130)
BUN: 15 mg/dL (ref 7–25)
CO2: 27 mmol/L (ref 20–32)
Calcium: 10 mg/dL (ref 8.6–10.3)
Chloride: 101 mmol/L (ref 98–110)
Creat: 1.03 mg/dL (ref 0.60–1.29)
Globulin: 2.8 g/dL (calc) (ref 1.9–3.7)
Glucose, Bld: 89 mg/dL (ref 65–99)
Potassium: 4.2 mmol/L (ref 3.5–5.3)
Sodium: 138 mmol/L (ref 135–146)
Total Bilirubin: 2 mg/dL — ABNORMAL HIGH (ref 0.2–1.2)
Total Protein: 7.8 g/dL (ref 6.1–8.1)
eGFR: 94 mL/min/{1.73_m2} (ref >=60)

## 2022-07-30 LAB — PSA, TOTAL AND FREE
PSA, % Free: 20 % (calc) — ABNORMAL LOW (ref >25)
PSA, Free: 0.2 ng/mL
PSA, Total: 1 ng/mL (ref <=4.0)

## 2022-07-31 NOTE — Progress Notes (Signed)
PSA - low 

## 2022-08-13 DIAGNOSIS — N5082 Scrotal pain: Secondary | ICD-10-CM | POA: Insufficient documentation

## 2022-08-13 DIAGNOSIS — R1032 Left lower quadrant pain: Secondary | ICD-10-CM | POA: Insufficient documentation

## 2022-08-13 NOTE — Telephone Encounter (Signed)
Order US scrotum with doppler

## 2022-08-14 NOTE — Addendum Note (Signed)
Addended byAnnamaria Helling on: 08/14/2022 10:42 AM   Modules accepted: Orders

## 2022-08-14 NOTE — Telephone Encounter (Signed)
Ordered

## 2022-09-04 ENCOUNTER — Ambulatory Visit (INDEPENDENT_AMBULATORY_CARE_PROVIDER_SITE_OTHER): Payer: 59

## 2022-09-04 DIAGNOSIS — N50812 Left testicular pain: Secondary | ICD-10-CM | POA: Diagnosis not present

## 2022-09-04 DIAGNOSIS — N5082 Scrotal pain: Secondary | ICD-10-CM

## 2022-09-04 DIAGNOSIS — R1032 Left lower quadrant pain: Secondary | ICD-10-CM | POA: Diagnosis not present

## 2022-09-08 NOTE — Progress Notes (Signed)
No changes with doppler. Schedule a follow up with urology and bring the u/s disc for them to look at and see what they think next part of plan should be.

## 2022-12-22 ENCOUNTER — Other Ambulatory Visit: Payer: Self-pay | Admitting: Physician Assistant

## 2022-12-22 DIAGNOSIS — F419 Anxiety disorder, unspecified: Secondary | ICD-10-CM

## 2023-01-04 ENCOUNTER — Other Ambulatory Visit: Payer: Self-pay | Admitting: Physician Assistant

## 2023-01-04 DIAGNOSIS — F419 Anxiety disorder, unspecified: Secondary | ICD-10-CM

## 2023-01-04 NOTE — Telephone Encounter (Signed)
Patient needs refill on Xanax '1mg'$ . Last appointment 07/28/22 Prescription written 07/06/22 180 tablets with 1 refill.

## 2023-04-01 ENCOUNTER — Other Ambulatory Visit: Payer: Self-pay | Admitting: Physician Assistant

## 2023-04-01 DIAGNOSIS — F419 Anxiety disorder, unspecified: Secondary | ICD-10-CM

## 2023-04-09 ENCOUNTER — Other Ambulatory Visit: Payer: Self-pay | Admitting: Physician Assistant

## 2023-04-09 DIAGNOSIS — F419 Anxiety disorder, unspecified: Secondary | ICD-10-CM

## 2023-04-09 NOTE — Telephone Encounter (Signed)
Patient called requesting a refill of ; Alprazolam 1mg   Pharmacy ;  [facility] [address] [place]  [phone]

## 2023-04-09 NOTE — Telephone Encounter (Signed)
Pt is scheduled on 04/23/2023 at 830.

## 2023-04-09 NOTE — Telephone Encounter (Signed)
Please call patient and have him schedule a follow-up appointment.  All patients have to be seen at least every 6 months for chronic scheduled drug refills.  Last evaluation for this medication was in August 2023.  Scheduled we can make sure he has enough medication until his appointment.

## 2023-04-09 NOTE — Telephone Encounter (Signed)
Please contact the patient to schedule an appt to f/u on controlled rx. Thanks in advance.

## 2023-04-12 NOTE — Telephone Encounter (Signed)
Per covering provider's note, overdue for a visit. Patient has an appt scheduled on 04/23/23. Needs refill.

## 2023-04-23 ENCOUNTER — Ambulatory Visit (INDEPENDENT_AMBULATORY_CARE_PROVIDER_SITE_OTHER): Payer: 59 | Admitting: Physician Assistant

## 2023-04-23 ENCOUNTER — Encounter: Payer: Self-pay | Admitting: Physician Assistant

## 2023-04-23 VITALS — BP 127/83 | HR 72 | Ht 72.0 in | Wt 199.1 lb

## 2023-04-23 DIAGNOSIS — F6389 Other impulse disorders: Secondary | ICD-10-CM

## 2023-04-23 DIAGNOSIS — Z79899 Other long term (current) drug therapy: Secondary | ICD-10-CM | POA: Diagnosis not present

## 2023-04-23 DIAGNOSIS — E039 Hypothyroidism, unspecified: Secondary | ICD-10-CM | POA: Diagnosis not present

## 2023-04-23 DIAGNOSIS — F419 Anxiety disorder, unspecified: Secondary | ICD-10-CM | POA: Diagnosis not present

## 2023-04-23 DIAGNOSIS — Z13220 Encounter for screening for lipoid disorders: Secondary | ICD-10-CM

## 2023-04-23 MED ORDER — METHYLPHENIDATE HCL 5 MG PO TABS
5.0000 mg | ORAL_TABLET | Freq: Two times a day (BID) | ORAL | 0 refills | Status: DC
Start: 2023-04-23 — End: 2024-02-04

## 2023-04-23 MED ORDER — BUPROPION HCL ER (XL) 150 MG PO TB24: 150.0000 mg | ORAL_TABLET | Freq: Every day | ORAL | 1 refills | Status: DC

## 2023-04-23 MED ORDER — ALPRAZOLAM 1 MG PO TABS: ORAL_TABLET | ORAL | 1 refills | Status: DC

## 2023-04-23 NOTE — Progress Notes (Unsigned)
Me

## 2023-04-23 NOTE — Progress Notes (Signed)
Established Patient Office Visit  Subjective   Patient ID: Gregroy Kaiser, male    DOB: Mar 10, 1981  Age: 42 y.o. MRN: [id number]  Chief Complaint  Patient presents with   Medication Refill    HPI Pt is a 42 yo male with ADHD, MDD, anxiety who presents to the clinic for follow up.   He is doing ok. He is having a lot of problems with focus while at work. He is concerned about losing his job if it does not get better. He is having to work on his own time over the weekend and at nights because his focus is so bad. He continues to take xanax but only in the evening. He did well when he was on adderall until he did not have it one day and then had suicidal thoughts. He has not had stimulant since. He wonders what he can do for as needed focus.  .. Active Ambulatory Problems    Diagnosis Date Noted   Anxiety 11/04/2012   Hypothyroidism 11/04/2012   Right knee meniscal tear 11/04/2012   Moderate obstructive sleep apnea 02/14/2013   Special screening examination for neoplasm of prostate 02/19/2015   Low energy 01/06/2017   Injury of left shoulder 01/19/2017   Moderate major depression (HCC) 07/11/2017   PTSD (post-traumatic stress disorder) 07/11/2017   Elevated bilirubin 04/18/2018   Testicular pain, left 04/14/2019   Recurrent major depressive disorder, in partial remission (HCC) 04/14/2019   H/O vasectomy 04/14/2019   Family history of bipolar disorder 08/01/2019   Unilateral groin pain, left 01/22/2020   Flank pain 01/22/2020   Inattention 01/22/2020   Chronic fatigue 01/22/2020   History of ADHD 01/22/2020   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    Objective:     BP 127/83 (BP Location: Right Arm, Patient Position: Sitting, Cuff Size: Normal)   Pulse 72   Ht 6' (1.829 m)   Wt 199 lb 1.9 oz (90.3 kg)   SpO2 97%   BMI 27.01 kg/m  BP Readings from Last 3 Encounters:  04/23/23 127/83  07/28/22 129/89  12/31/21 124/74   Wt Readings from Last 3 Encounters:  04/23/23 199 lb 1.9 oz (90.3 kg)  07/28/22 186 lb (84.4 kg)  07/06/22 182 lb (82.6 kg)    ..    04/23/2023    8:35 AM 04/23/2023    8:11 AM 07/28/2022    9:36 AM 07/06/2022   11:04 AM 12/31/2021    7:14 AM  Depression screen PHQ 2/9  Decreased Interest 0 0 0 0 0  Down, Depressed, Hopeless 0 0 0 0 0  PHQ - 2 Score 0 0 0 0 0  Altered sleeping 1   0   Tired, decreased energy 1   1   Change in appetite 0   0   Feeling bad or failure about yourself  0   0   Trouble concentrating 2   1   Moving slowly or fidgety/restless 0   0   Suicidal thoughts 0  0   PHQ-9 Score 4   2   Difficult doing work/chores Somewhat difficult  Not difficult at all Not difficult at all    .Marland Kitchen    04/23/2023    8:36 AM 07/06/2022   11:05 AM 12/31/2021    7:36 AM 09/26/2021    8:52 AM  GAD 7 : Generalized Anxiety Score  Nervous, Anxious, on Edge 2 0 1 2  Control/stop worrying 2 1 1 2   Worry too much - different things 2 1 1 2   Trouble relaxing 1 0 0 2  Restless 0 0 0 2  Easily annoyed or irritable 0 0 0 2  Afraid - awful might happen 1 0 0 2  Total GAD 7 Score 8 2 3 14   Anxiety Difficulty Somewhat difficult Not difficult at all Not difficult at all Very difficult      Physical Exam Constitutional:      Appearance: Normal appearance.  HENT:     Head: Normocephalic.  Cardiovascular:     Rate and Rhythm: Normal rate and regular rhythm.  Pulmonary:     Effort: Pulmonary effort is normal.     Breath sounds: Normal  breath sounds.  Neurological:     General: No focal deficit present.     Mental Status: He is alert and oriented to person, place, and time.  Psychiatric:        Mood and Affect: Mood normal.      The 10-year ASCVD risk score (Arnett DK, et al., 2019) is: 0.6%    Assessment & Plan:  Marland KitchenMarland KitchenKoven was seen today for medication refill.  Diagnoses and all orders for this visit:  Acquired hypothyroidism -     TSH  Anxiety -     buPROPion (WELLBUTRIN XL) 150 MG 24 hr tablet; Take 1 tablet (150 mg total) by mouth daily. -     ALPRAZolam (XANAX) 1 MG tablet; TAKE ONE TABLET BY MOUTH UP TO TWICE A DAY AS NEEDED FOR ANXIETY.  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   Fasting labs ordered Will adjust thyroid medication accordingly to labs .Marland KitchenPDMP reviewed during this encounter. Xanax refilled Discussed focus and stimulant Sent as needed ritalin up to 10mg  a day Concerned about mood changes and transitioning to self harm or suicidal thoughts Discussed this with patient and he is to stop ritalin and let us know if he does Consider daily non-stimulant options strattera and intuniv  HO given Follow up in 3 months  Spent 36 minutes with patient and in chart review and discussing medication and plans.   Return in about 3 months (around 07/24/2023).    Tandy Gaw, PA-C

## 2023-04-23 NOTE — Patient Instructions (Addendum)
Atomoxetine Capsules What is this medication? ATOMOXETINE (AT oh mox e teen) treats attention-deficit hyperactivity disorder (ADHD). It works by improving focus and reducing impulsive behavior. It belongs to a group of medications called SNRIs. This medicine may be used for other purposes; ask your health care provider or pharmacist if you have questions. COMMON BRAND NAME(S): Strattera What should I tell my care team before I take this medication? They need to know if you have any of these conditions: Glaucoma High or low blood pressure History of stroke Irregular heartbeat or other cardiac disease Liver disease Mania or bipolar disorder Pheochromocytoma Suicidal thoughts, plans, or attempt by you or a family member An unusual or allergic reaction to atomoxetine, other medications, foods, dyes, or preservatives Pregnant or trying to get pregnant Breastfeeding How should I use this medication? Take this medication by mouth with water. Take it as directed on the prescription label at the same time every day. Do not cut, crush, or chew this medication. Swallow the capsules whole. You can take it with or without food. If it upsets your stomach, take it with food. If you have difficulty sleeping, and you take more than 1 dose per day, take your last dose before 6 PM. Keep taking it unless your care team tells you to stop. A special MedGuide will be given to you by the pharmacist with each prescription and refill. Be sure to read this information carefully each time. Talk to your care team about the use of this medication in children. While it may be prescribed for children as young as 6 years for selected conditions, precautions do apply. Overdosage: If you think you have taken too much of this medicine contact a poison control center or emergency room at once. NOTE: This medicine is only for you. Do not share this medicine with others. What if I miss a dose? If you miss a dose, take it as soon  as you can. If it is almost time for your next dose, take only that dose. Do not take double or extra doses. What may interact with this medication? Do not take this medication with any of the following: Cisapride Dronedarone MAOIs, such as Carbex, Eldepryl, Marplan, Nardil, and Parnate Pimozide Reboxetine Thioridazine This medication may also interact with the following: Certain medications for blood pressure, heart disease, irregular heart beat Certain medications for lung disease, such as albuterol Certain medications for mental heath conditions Cold or allergy medications Dofetilide Fluoxetine Medications that increase blood pressure, such as dopamine, dobutamine, or ephedrine Other medications that cause heart rhythm changes Paroxetine Quinidine Stimulant medications for ADHD, weight loss, or staying awake Ziprasidone This list may not describe all possible interactions. Give your health care provider a list of all the medicines, herbs, non-prescription drugs, or dietary supplements you use. Also tell them if you smoke, drink alcohol, or use illegal drugs. Some items may interact with your medicine. What should I watch for while using this medication? Visit your care team for regular checks on your progress. It may take a week or more before you see the benefit from this medication. This is why it is very important to continue taking the medication and not miss any doses. If you have been taking this medication regularly for some time, do not suddenly stop taking it. Ask your care team for advice. Rarely, this medication may increase thoughts of suicide or suicide attempts in children and teenagers. Call your child's care team right away if your child or teenager has new  or increased thoughts of suicide or has changes in mood or behavior like becoming irritable or anxious. Regularly monitor your child for these behavioral changes. Contact you care team right away if you have an  erection that lasts longer than 4 hours or if it becomes painful. This may be a sign of serious problem and must be treated right away to prevent permanent damage. This medication may affect your coordination, reaction time, or judgment. Do not drive or operate machinery until you know how this medication affects you. Sit up or stand slowly to reduce the risk of dizzy or fainting spells. Drinking alcohol with this medication can increase the risk of these side effects. Do not treat yourself for coughs, colds, or allergies without asking your care team for advice. Some ingredients can increase possible side effects. Your mouth may get dry. Chewing sugarless gum or sucking hard candy and drinking plenty of water will help. What side effects may I notice from receiving this medication? Side effects that you should report to your care team as soon as possible: Allergic reactions--skin rash, itching, hives, swelling of the face, lips, tongue, or throat Heart rhythm changes--fast or irregular heartbeat, dizziness, feeling faint or lightheaded, chest pain, trouble breathing Increase in blood pressure Liver injury-- right upper belly pain, loss of appetite, nausea, light-colored stool, dark yellow or brown urine, yellowing skin or eyes, unusual weakness or fatigue Mood and behavior changes--anxiety, nervousness, confusion, hallucinations, irritability, hostility, thoughts of suicide or self-harm, worsening mood, feelings of depression Painful or prolonged erection Stroke in adults--sudden numbness or weakness of the face, arm, or leg, trouble speaking, confusion, trouble walking, loss of balance or coordination, dizziness, severe headache, change in vision Trouble passing urine Side effects that usually do not require medical attention (report to your care team if they continue or are bothersome): Change in sex drive or performance Constipation Dizziness Dry mouth Loss of appetite Nausea Stomach  pain Vomiting This list may not describe all possible side effects. Call your doctor for medical advice about side effects. You may report side effects to FDA at 1-800-FDA-1088. Where should I keep my medication? Keep out of the reach of children and pets. Store at room temperature between 15 and 30 degrees C (59 and 86 degrees F). Throw away any unused medication after the expiration date. NOTE: This sheet is a summary. It may not cover all possible information. If you have questions about this medicine, talk to your doctor, pharmacist, or health care provider.  2024 Elsevier/Gold Standard (2022-05-18 00:00:00)  Guanfacine Extended-Release Tablets What is this medication? GUANFACINE [facility] fa seen) treats attention-deficit hyperactivity disorder (ADHD). It works by improving focus and reducing impulsive behavior. This medicine may be used for other purposes; ask your health care provider or pharmacist if you have questions. COMMON BRAND NAME(S): Intuniv What should I tell my care team before I take this medication? They need to know if you have any of these conditions: High blood pressure Kidney disease Liver disease Low blood pressure Slow heart rate An unusual or allergic reaction to guanfacine, other medications, foods, dyes, or preservatives Pregnant or trying to get pregnant Breast-feeding How should I use this medication? Take this medication by mouth with a glass of water. Follow the directions on the prescription label. Do not cut, crush, or chew this medication. Do not take this medication with a high-fat meal. Take your medication at regular intervals. Do not take it more often than directed. Do not stop taking except on your care  team's advice. Stopping this medication too quickly may cause serious side effects. Ask your care team for advice. Talk to your care team about the use of this medication in children. While it may be prescribed for children as young as 6 years for  selected conditions, precautions do apply. Overdosage: If you think you have taken too much of this medicine contact a poison control center or emergency room at once. NOTE: This medicine is only for you. Do not share this medicine with others. What if I miss a dose? If you miss a dose, take it as soon as you can. If it is almost time for your next dose, take only that dose. Do not take double or extra doses. If you miss 2 or more doses in a row, you should contact your care team. You may need to restart your medication at a lower dose. What may interact with this medication? This medication may interact with the following: Certain medications for blood pressure, heart disease, irregular heartbeat Certain medications for mental health conditions Certain medications for seizures, such as carbamazepine, phenobarbital, phenytoin Certain medications for sleep Ketoconazole Opioid medications Rifampin This list may not describe all possible interactions. Give your health care provider a list of all the medicines, herbs, non-prescription drugs, or dietary supplements you use. Also tell them if you smoke, drink alcohol, or use illegal drugs. Some items may interact with your medicine. What should I watch for while using this medication? Visit your care team for regular checks on your progress. Check your heart rate and blood pressure as directed. Ask your care team what your heart rate and blood pressure should be and when you should contact them. You may get dizzy or drowsy. Do not drive, use machinery, or do anything that needs mental alertness until you know how this medication affects you. Do not stand or sit up quickly, especially if you are an older patient. This reduces the risk of dizzy or fainting spells. Alcohol can make you more drowsy and dizzy. Avoid alcoholic drinks. Avoid becoming dehydrated or overheated while taking this medication. Tell your care team if you have been vomiting and cannot  take this medication because you may be at risk for a sudden and large increase in blood pressure called rebound hypertension. Your mouth may get dry. Chewing sugarless gum or sucking hard candy, and drinking plenty of water may help. Contact your care team if the problem does not go away or is severe. What side effects may I notice from receiving this medication? Side effects that you should report to your care team as soon as possible: Allergic reactions--skin rash, itching, hives, swelling of the face, lips, tongue, or throat Low blood pressure--dizziness, feeling faint or lightheaded, blurry vision Slow heartbeat--dizziness, feeling faint or lightheaded, confusion, trouble breathing, unusual weakness or fatigue Side effects that usually do not require medical attention (report to your care team if they continue or are bothersome): Dizziness Drowsiness Dry mouth Fatigue Headache Nausea Stomach pain This list may not describe all possible side effects. Call your doctor for medical advice about side effects. You may report side effects to FDA at 1-800-FDA-1088. Where should I keep my medication? Keep out of the reach of children and pets. Store at room temperature between 15 and 30 degrees C (59 and 86 degrees F). Throw away any unused medication after the expiration date. NOTE: This sheet is a summary. It may not cover all possible information. If you have questions about this medicine, talk to  your doctor, pharmacist, or health care provider.  2024 Elsevier/Gold Standard (2021-09-22 00:00:00)

## 2023-04-26 ENCOUNTER — Encounter: Payer: Self-pay | Admitting: Physician Assistant

## 2023-04-26 NOTE — Progress Notes (Signed)
Joshua Kaiser,  Cholesterol looks great! Kidney, liver, glucose looks good.  Thyroid is trending to upper limits toward HYPO thyroid. Optimal level is TSH 1-2. We could increase thyroid medication just a little and see if that helps you feel a little better. Thoughts?

## 2023-04-28 LAB — TEST AUTHORIZATION

## 2023-04-28 NOTE — Progress Notes (Signed)
Waiting on testosterone.

## 2023-05-04 ENCOUNTER — Other Ambulatory Visit: Payer: Self-pay | Admitting: Physician Assistant

## 2023-05-04 LAB — COMPLETE METABOLIC PANEL WITH GFR
AG Ratio: 1.7 (calc) (ref 1.0–2.5)
ALT: 21 U/L (ref 9–46)
AST: 18 U/L (ref 10–40)
Albumin: 4.7 g/dL (ref 3.6–5.1)
Alkaline phosphatase (APISO): 38 U/L (ref 36–130)
BUN: 13 mg/dL (ref 7–25)
CO2: 26 mmol/L (ref 20–32)
Calcium: 10 mg/dL (ref 8.6–10.3)
Chloride: 105 mmol/L (ref 98–110)
Creat: 1.09 mg/dL (ref 0.60–1.29)
Globulin: 2.7 g/dL (calc) (ref 1.9–3.7)
Glucose, Bld: 89 mg/dL (ref 65–99)
Potassium: 4.4 mmol/L (ref 3.5–5.3)
Sodium: 140 mmol/L (ref 135–146)
Total Bilirubin: 0.9 mg/dL (ref 0.2–1.2)
Total Protein: 7.4 g/dL (ref 6.1–8.1)
eGFR: 87 mL/min/{1.73_m2} (ref >=60)

## 2023-05-04 LAB — TEST AUTHORIZATION

## 2023-05-04 LAB — LIPID PANEL W/REFLEX DIRECT LDL
Cholesterol: 158 mg/dL (ref <200)
HDL: 64 mg/dL (ref >=40)
LDL Cholesterol (Calc): 80 mg/dL (calc)
Non-HDL Cholesterol (Calc): 94 mg/dL (calc) (ref <130)
Total CHOL/HDL Ratio: 2.5 (calc) (ref <5.0)
Triglycerides: 64 mg/dL (ref <150)

## 2023-05-04 LAB — TESTOSTERONE, TOTAL, LC/MS/MS: Testosterone, Total, LC-MS-MS: 427 ng/dL (ref 250–1100)

## 2023-05-04 LAB — TSH: TSH: 3.19 mIU/L (ref 0.40–4.50)

## 2023-05-04 MED ORDER — LEVOTHYROXINE SODIUM 125 MCG PO TABS: ORAL_TABLET | ORAL | 1 refills | Status: DC

## 2023-05-04 NOTE — Progress Notes (Signed)
Testosterone looks pretty good at 427. Lets keep thyroid dose the same for now and recheck in 3-6 months.

## 2023-07-30 ENCOUNTER — Encounter: Payer: Self-pay | Admitting: Physician Assistant

## 2023-07-30 ENCOUNTER — Telehealth (INDEPENDENT_AMBULATORY_CARE_PROVIDER_SITE_OTHER): Payer: 59 | Admitting: Physician Assistant

## 2023-07-30 VITALS — HR 89

## 2023-07-30 DIAGNOSIS — F419 Anxiety disorder, unspecified: Secondary | ICD-10-CM | POA: Diagnosis not present

## 2023-07-30 DIAGNOSIS — F901 Attention-deficit hyperactivity disorder, predominantly hyperactive type: Secondary | ICD-10-CM | POA: Diagnosis not present

## 2023-07-30 DIAGNOSIS — F3341 Major depressive disorder, recurrent, in partial remission: Secondary | ICD-10-CM | POA: Diagnosis not present

## 2023-07-30 DIAGNOSIS — F6389 Other impulse disorders: Secondary | ICD-10-CM

## 2023-07-30 DIAGNOSIS — R41840 Attention and concentration deficit: Secondary | ICD-10-CM

## 2023-07-30 MED ORDER — BUPROPION HCL ER (XL) 150 MG PO TB24
ORAL_TABLET | ORAL | 0 refills | Status: DC
Start: 2023-07-30 — End: 2024-01-10

## 2023-07-30 NOTE — Progress Notes (Unsigned)
..Virtual Visit via Video Note  I connected with Joshua Kaiser on 07/30/23 at  9:10 AM EDT by a video enabled telemedicine application and verified that I am speaking with the correct person using two identifiers.  Location: Patient: home Provider: clinic  .Marland KitchenParticipating in visit:  Patient: Joshua Kaiser Provider: Tandy Gaw PA-C Provider in training: Joshua Anna PA-S   I discussed the limitations of evaluation and management by telemedicine and the availability of in person appointments. The patient expressed understanding and agreed to proceed.  History of Present Illness: Pt is a 42 yo male who calls into the clinic to follow up on medications for inattention/impulse control.   He is doing well. He takes xanax daily with wellbutrin. He only uses ritalin when he needs it and still has some left in bottle when given in June. It is helpful to have for work purposes. He continues to feel like energy and focus are not where they should be but overall no concerns.   .. Active Ambulatory Problems    Diagnosis Date Noted   Anxiety 11/04/2012   Hypothyroidism 11/04/2012   Right knee meniscal tear 11/04/2012   Moderate obstructive sleep apnea 02/14/2013   Special screening examination for neoplasm of prostate 02/19/2015   Low energy 01/06/2017   Injury of left shoulder 01/19/2017   Moderate major depression (HCC) 07/11/2017   PTSD (post-traumatic stress disorder) 07/11/2017   Elevated bilirubin 04/18/2018   Testicular pain, left 04/14/2019   Recurrent major depressive disorder, in partial remission (HCC) 04/14/2019   H/O vasectomy 04/14/2019   Family history of bipolar disorder 08/01/2019   Unilateral groin pain, left 01/22/2020   Flank pain 01/22/2020   Inattention 01/22/2020   Chronic fatigue 01/22/2020   History of ADHD 01/22/2020   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 01/22/2020   Obsessional thoughts  01/22/2020   Attention deficit hyperactivity disorder (ADHD), combined type 04/05/2020   Suicidal thoughts 09/29/2021   Chronic pain of left knee 12/31/2021   Left groin pain 08/13/2022   Pain in scrotum 08/13/2022   Resolved Ambulatory Problems    Diagnosis Date Noted   Poison ivy dermatitis 01/22/2020   Bipolar II disorder (HCC) 09/29/2021   No Additional Past Medical History     Observations/Objective: No acute distress Normal mood and appearance  .Marland Kitchen    07/30/2023   10:38 AM 04/23/2023    8:35 AM 04/23/2023    8:11 AM 07/28/2022    9:36 AM 07/06/2022   11:04 AM  Depression screen PHQ 2/9  Decreased Interest 0 0 0 0 0  Down, Depressed, Hopeless 0 0 0 0 0  PHQ - 2 Score 0 0 0 0 0  Altered sleeping  1   0  Tired, decreased energy  1   1  Change in appetite  0   0  Feeling bad or failure about yourself   0   0  Trouble concentrating  2   1  Moving slowly or fidgety/restless  0   0  Suicidal thoughts  0   0  PHQ-9 Score  4   2  Difficult doing work/chores  Somewhat difficult  Not difficult at all Not difficult at all     Assessment and Plan: Marland KitchenMarland KitchenDiagnoses and all orders for this visit:  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  Recurrent major depressive disorder, in  partial remission (HCC)  Inattention   Trial of wellbutrin 300mg  daily Continue same medications Encouraged regular exercise and good sleep. Follow up in 3 months.     Follow Up Instructions:    I discussed the assessment and treatment plan with the patient. The patient was provided an opportunity to ask questions and all were answered. The patient agreed with the plan and demonstrated an understanding of the instructions.   The patient was advised to call back or seek an in-person evaluation if the symptoms worsen or if the condition  fails to improve as anticipated.    Tandy Gaw, PA-C

## 2024-01-09 ENCOUNTER — Other Ambulatory Visit: Payer: Self-pay | Admitting: Physician Assistant

## 2024-01-09 DIAGNOSIS — F419 Anxiety disorder, unspecified: Secondary | ICD-10-CM

## 2024-02-04 ENCOUNTER — Ambulatory Visit (INDEPENDENT_AMBULATORY_CARE_PROVIDER_SITE_OTHER): Payer: 59 | Admitting: Physician Assistant

## 2024-02-04 ENCOUNTER — Encounter: Payer: Self-pay | Admitting: Physician Assistant

## 2024-02-04 VITALS — BP 131/73 | HR 71 | Ht 72.0 in | Wt 199.0 lb

## 2024-02-04 DIAGNOSIS — R1032 Left lower quadrant pain: Secondary | ICD-10-CM

## 2024-02-04 DIAGNOSIS — F419 Anxiety disorder, unspecified: Secondary | ICD-10-CM | POA: Diagnosis not present

## 2024-02-04 MED ORDER — ALPRAZOLAM 1 MG PO TABS: ORAL_TABLET | ORAL | 1 refills | Status: AC

## 2024-02-04 NOTE — Progress Notes (Signed)
 Established Patient Office Visit  Subjective   Patient ID: Joshua Kaiser, male    DOB: 17-Feb-1981  Age: 43 y.o. MRN: [id number]  Chief Complaint  Patient presents with   Abdominal Pain    Midsection abdominal pain onset 3 mo     HPI Pt is a 43 yo male who presents to the clinic to follow up on left inguinal pain that persist. Pt had a vasectomy 10 years ago and has had pain in left testicle and inguinal area since. He ended up having a varicole surgery November 2024 with [facility]. His left testicular pain resolved but not his left inguinal pain and pressure. He got so bad in feb he went to ED on 01/02/2024. CT abdomen and pelvis and ultrasound did not show any cause of pain. He is active and at times activity does make worse but ultimately he feels the pressure all the time but does not feel a bulge. He has no swelling of testicle, leg or abdomen. He has no problem with sexual activity. He just wants answers.     ROS See HPI.    Objective:     BP 131/73   Pulse 71   Ht 6' (1.829 m)   Wt 199 lb (90.3 kg)   SpO2 99%   BMI 26.99 kg/m  BP Readings from Last 3 Encounters:  02/04/24 131/73  04/23/23 127/83  07/28/22 129/89   Wt Readings from Last 3 Encounters:  02/04/24 199 lb (90.3 kg)  04/23/23 199 lb 1.9 oz (90.3 kg)  07/28/22 186 lb (84.4 kg)    ..    02/04/2024    8:34 AM 02/04/2024    8:17 AM 07/30/2023   10:38 AM 04/23/2023    8:35 AM 04/23/2023    8:11 AM  Depression screen PHQ 2/9  Decreased Interest 0 0 0 0 0  Down, Depressed, Hopeless 0 0 0 0 0  PHQ - 2 Score 0 0 0 0 0  Altered sleeping    1   Tired, decreased energy    1   Change in appetite    0   Feeling bad or failure about yourself     0   Trouble concentrating    2   Moving slowly or fidgety/restless    0   Suicidal thoughts    0   PHQ-9 Score    4   Difficult doing work/chores    Somewhat difficult        Physical Exam Constitutional:      Appearance: He is well-developed.  HENT:      Head: Normocephalic.  Cardiovascular:     Rate and Rhythm: Normal rate and regular rhythm.  Pulmonary:     Effort: Pulmonary effort is normal.  Abdominal:     General: Bowel sounds are normal. There is no distension.     Palpations: Abdomen is soft.     Tenderness: There is no abdominal tenderness.     Hernia: No hernia is present. There is no hernia in the left inguinal area or right inguinal area.  Neurological:     General: No focal deficit present.     Mental Status: He is alert.  Psychiatric:        Mood and Affect: Mood normal.      The 10-year ASCVD risk score (Arnett DK, et al., 2019) is: 0.7%    Assessment & Plan:  Marland KitchenMarland KitchenReace was seen today for abdominal pain.  Diagnoses and all orders for this  visit:  Left groin pain -     MR PELVIS WO CONTRAST; Future  Anxiety -     ALPRAZolam (XANAX) 1 MG tablet; TAKE ONE TABLET BY MOUTH UP TO TWICE A DAY AS NEEDED FOR ANXIETY.   Pt had CT and scrotal U/S in ED 12/2023 Pt continues to have symptoms MRI ordered for evaluation  Doing well with mood and anxiety .Marland KitchenPDMP reviewed during this encounter. Xanax refilled today Follow up in 6 months  Tandy Gaw, PA-C

## 2024-02-08 ENCOUNTER — Encounter: Payer: Self-pay | Admitting: Physician Assistant

## 2024-03-04 ENCOUNTER — Ambulatory Visit

## 2024-03-04 DIAGNOSIS — R1032 Left lower quadrant pain: Secondary | ICD-10-CM

## 2024-03-24 ENCOUNTER — Ambulatory Visit: Payer: Self-pay | Admitting: Physician Assistant

## 2024-03-24 DIAGNOSIS — N5082 Scrotal pain: Secondary | ICD-10-CM

## 2024-03-24 DIAGNOSIS — R1032 Left lower quadrant pain: Secondary | ICD-10-CM

## 2024-03-24 NOTE — Progress Notes (Signed)
 MRI shows: lesion of right femur but appears benign with no aggressive features.   Hydrocele in left testicle but no causes for pain.

## 2024-04-05 NOTE — Telephone Encounter (Signed)
 Attempted call to patient. Left a voice mail message requesting a return call . Will also send patient a message via Mychart.

## 2024-04-05 NOTE — Telephone Encounter (Signed)
 I would recommend we refer him to either sports med or Ortho at this point.  Please see if he has a preference for provider or location and okay to go ahead and place the referral.

## 2024-04-14 NOTE — Telephone Encounter (Signed)
 He wants angiography before referral. Reasonable?

## 2024-04-21 ENCOUNTER — Other Ambulatory Visit: Payer: Self-pay | Admitting: Physician Assistant

## 2024-04-21 ENCOUNTER — Ambulatory Visit: Payer: Self-pay

## 2024-04-21 MED ORDER — AMOXICILLIN-POT CLAVULANATE 875-125 MG PO TABS
1.0000 | ORAL_TABLET | Freq: Two times a day (BID) | ORAL | 0 refills | Status: DC
Start: 2024-04-21 — End: 2024-05-26

## 2024-04-21 NOTE — Progress Notes (Signed)
 Pt c/o pain over surgical site of left groin. Start augmentin. If symptoms worsening go to UC or make follow up appt.

## 2024-04-21 NOTE — Telephone Encounter (Signed)
 FYI Only or Action Required?: Action required by provider  Patient was last seen in primary care on 02/04/2024 by Araceli Knight, PA-C. Called Nurse Triage reporting Groin Pain. Symptoms began several weeks ago. Interventions attempted: Nothing. Symptoms are: gradually worsening.  Triage Disposition: See Physician Within 24 Hours  Patient/caregiver understands and will follow disposition?: No, refuses dispositionCopied from CRM [id number]. Topic: Clinical - Medical Advice >> Apr 21, 2024 11:20 AM Adrianna P wrote: Reason for CRM: Patient feels like he has an infection, and would like to know if some antibiotics could be called in for him, The area he had surgery on is tender. Please call [phone] Reason for Disposition  Looks infected (e.g., draining sore, ulcer, rash is painful to touch)  Answer Assessment - Initial Assessment Questions 1. SYMPTOM: What's the main symptom you're concerned about? (e.g., discharge from penis, rash, pain, itching, swelling)     pain 2. LOCATION: Where is the pain located?     Above penis 3. ONSET: When did   start?     Several weeks ago  4. PAIN: Is there any pain? If Yes, ask: How bad is it?  (Scale 1-10; or mild, moderate, severe)     Tender to touch  5. URINE: Any difficulty passing urine? If Yes, ask: When was the last time?     Denis  6. CAUSE: What do you think is causing the symptoms?     Prior surgery   Chronic problem form surgery from  09/2023. Pt stated he can now pinpoint where the pain is coming from and states it is at surgical site above penis. Pt stated no swelling, no discharge, no fever. Only tender to touch. [place]  and can't do appt next week. No appts open today. Pt is asking if antibiotic can be called in without an appt. RN advised he go to UC but pt is refusing at this time.  Protocols used: Penis and Scrotum Symptoms-A-AH

## 2024-04-21 NOTE — Telephone Encounter (Signed)
 Patient informed.  Will go to UC If symtpoms worsen or are concerning

## 2024-04-21 NOTE — Telephone Encounter (Signed)
 Pt c/o pain over surgical site of left groin. Start augmentin. If symptoms worsening go to UC or make follow up appt.

## 2024-04-21 NOTE — Telephone Encounter (Signed)
 Placed referral for outside ortho!

## 2024-04-25 ENCOUNTER — Ambulatory Visit: Admitting: Physician Assistant

## 2024-04-26 NOTE — Telephone Encounter (Signed)
 In referral tab - message was sent to patient regarding referral and scheduling.

## 2024-05-01 ENCOUNTER — Ambulatory Visit: Admitting: Physician Assistant

## 2024-05-23 ENCOUNTER — Encounter: Payer: Self-pay | Admitting: Physician Assistant

## 2024-05-26 ENCOUNTER — Other Ambulatory Visit: Payer: Self-pay | Admitting: Physician Assistant

## 2024-06-13 ENCOUNTER — Telehealth: Payer: Self-pay

## 2024-06-13 NOTE — Telephone Encounter (Signed)
 Spoke with patient. State wife and son and daughter all had symptoms of cold slight fever that only lasted a day or two , cough , congestion- difficulty sleeping . States he would find it difficult to drive as he does not feel well.  He states his wife was given a zpack and cough medication and he was hoping for the same without being seen.  He is willing to do a virtual appointment with Vermell Bologna- ( he does not want to see a different provider )  can patient be seen virtual and is a work in available for him to be seen?

## 2024-06-13 NOTE — Telephone Encounter (Signed)
 Copied from CRM [id number]. Topic: Clinical - Medical Advice >> Jun 13, 2024  2:18 PM Adrianna P wrote: Reason for CRM: Patient is dealing with a cold. Fever, congestion, cough, chills, headache He needs a prescription for a z pack and cough syrup. [facility] to [facility] on [address] Patient can be reached at [phone]   ----------------------------------------------------------------------- From previous Reason for Contact - Scheduling: Patient/patient representative is calling to schedule an appointment. Refer to attachments for appointment information.

## 2024-06-21 NOTE — Telephone Encounter (Signed)
 Attempted call to patient. To check that symptoms have improved. Left a voice mail message requesting a returncall.

## 2024-08-17 ENCOUNTER — Other Ambulatory Visit: Payer: Self-pay | Admitting: Physician Assistant

## 2024-11-28 ENCOUNTER — Other Ambulatory Visit: Payer: Self-pay | Admitting: Physician Assistant

## 2024-11-28 DIAGNOSIS — F419 Anxiety disorder, unspecified: Secondary | ICD-10-CM
